# Patient Record
Sex: Male | Born: 1995 | Race: White | Hispanic: No | Marital: Single | Smoking: Former smoker
Health system: Southern US, Community
[De-identification: ages and names within clinical notes are randomized; demographics above are authoritative.]

## PROBLEM LIST (undated history)

## (undated) ENCOUNTER — Ambulatory Visit: Admission: EM | Payer: Medicaid Other

## (undated) DIAGNOSIS — J9601 Acute respiratory failure with hypoxia: Secondary | ICD-10-CM

## (undated) DIAGNOSIS — T1491XA Suicide attempt, initial encounter: Secondary | ICD-10-CM

## (undated) HISTORY — DX: Acute respiratory failure with hypoxia: J96.01

## (undated) HISTORY — PX: IR GASTR TUBE CONVERT GASTR-JEJ PER W/FL MOD SED: IMG2332

---
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
## 2014-06-24 ENCOUNTER — Emergency Department (HOSPITAL_COMMUNITY): Payer: No Typology Code available for payment source

## 2014-06-24 ENCOUNTER — Encounter (HOSPITAL_COMMUNITY): Payer: Self-pay | Admitting: Emergency Medicine

## 2014-06-24 ENCOUNTER — Emergency Department (HOSPITAL_COMMUNITY)
Admission: EM | Admit: 2014-06-24 | Discharge: 2014-06-24 | Disposition: A | Discharge status: Home / Self Care | Payer: No Typology Code available for payment source | Attending: Emergency Medicine | Admitting: Emergency Medicine

## 2014-06-24 DIAGNOSIS — S0181XA Laceration without foreign body of other part of head, initial encounter: Secondary | ICD-10-CM

## 2014-06-24 DIAGNOSIS — Y92410 Unspecified street and highway as the place of occurrence of the external cause: Secondary | ICD-10-CM | POA: Insufficient documentation

## 2014-06-24 DIAGNOSIS — IMO0002 Reserved for concepts with insufficient information to code with codable children: Secondary | ICD-10-CM | POA: Insufficient documentation

## 2014-06-24 DIAGNOSIS — S8990XA Unspecified injury of unspecified lower leg, initial encounter: Secondary | ICD-10-CM | POA: Insufficient documentation

## 2014-06-24 DIAGNOSIS — S46909A Unspecified injury of unspecified muscle, fascia and tendon at shoulder and upper arm level, unspecified arm, initial encounter: Secondary | ICD-10-CM | POA: Insufficient documentation

## 2014-06-24 DIAGNOSIS — S4992XA Unspecified injury of left shoulder and upper arm, initial encounter: Secondary | ICD-10-CM

## 2014-06-24 DIAGNOSIS — Z87891 Personal history of nicotine dependence: Secondary | ICD-10-CM | POA: Insufficient documentation

## 2014-06-24 DIAGNOSIS — S4980XA Other specified injuries of shoulder and upper arm, unspecified arm, initial encounter: Secondary | ICD-10-CM | POA: Insufficient documentation

## 2014-06-24 DIAGNOSIS — S99919A Unspecified injury of unspecified ankle, initial encounter: Secondary | ICD-10-CM

## 2014-06-24 DIAGNOSIS — S060X1A Concussion with loss of consciousness of 30 minutes or less, initial encounter: Secondary | ICD-10-CM | POA: Insufficient documentation

## 2014-06-24 DIAGNOSIS — Y9389 Activity, other specified: Secondary | ICD-10-CM | POA: Insufficient documentation

## 2014-06-24 DIAGNOSIS — S99929A Unspecified injury of unspecified foot, initial encounter: Secondary | ICD-10-CM

## 2014-06-24 DIAGNOSIS — S0990XA Unspecified injury of head, initial encounter: Secondary | ICD-10-CM | POA: Insufficient documentation

## 2014-06-24 DIAGNOSIS — S0180XA Unspecified open wound of other part of head, initial encounter: Secondary | ICD-10-CM | POA: Insufficient documentation

## 2014-06-24 DIAGNOSIS — S8991XA Unspecified injury of right lower leg, initial encounter: Secondary | ICD-10-CM

## 2014-06-24 HISTORY — DX: Suicide attempt, initial encounter: T14.91XA

## 2014-06-24 LAB — CBC WITH DIFFERENTIAL/PLATELET
BASOS ABS: 0 10*3/uL (ref 0.0–0.1)
Basophils Relative: 0 % (ref 0–1)
EOS ABS: 0 10*3/uL (ref 0.0–0.7)
EOS PCT: 0 % (ref 0–5)
HCT: 47.6 % (ref 39.0–52.0)
Hemoglobin: 16.7 g/dL (ref 13.0–17.0)
Lymphocytes Relative: 10 % — ABNORMAL LOW (ref 12–46)
Lymphs Abs: 1.8 10*3/uL (ref 0.7–4.0)
MCH: 32.6 pg (ref 26.0–34.0)
MCHC: 35.1 g/dL (ref 30.0–36.0)
MCV: 93 fL (ref 78.0–100.0)
Monocytes Absolute: 0.9 10*3/uL (ref 0.1–1.0)
Monocytes Relative: 5 % (ref 3–12)
Neutro Abs: 14.6 10*3/uL — ABNORMAL HIGH (ref 1.7–7.7)
Neutrophils Relative %: 85 % — ABNORMAL HIGH (ref 43–77)
PLATELETS: 254 10*3/uL (ref 150–400)
RBC: 5.12 MIL/uL (ref 4.22–5.81)
RDW: 12.3 % (ref 11.5–15.5)
WBC: 17.3 10*3/uL — AB (ref 4.0–10.5)

## 2014-06-24 MED ORDER — ONDANSETRON HCL 4 MG/2ML IJ SOLN: 4.0000 mg | Freq: Once | INTRAMUSCULAR | Status: DC

## 2014-06-24 MED ORDER — SODIUM CHLORIDE 0.9 % IV BOLUS (SEPSIS)
500.0000 mL | Freq: Once | INTRAVENOUS | Status: AC
  Administered 2014-06-24: 500 mL via INTRAVENOUS

## 2014-06-24 MED ORDER — IOHEXOL 300 MG/ML  SOLN
100.0000 mL | Freq: Once | INTRAMUSCULAR | Status: AC | PRN
  Administered 2014-06-24: 100 mL via INTRAVENOUS

## 2014-06-24 MED ORDER — NAPROXEN 500 MG PO TABS: 500.0000 mg | ORAL_TABLET | Freq: Two times a day (BID) | ORAL | Status: DC

## 2014-06-24 MED ORDER — HYDROMORPHONE HCL PF 1 MG/ML IJ SOLN
1.0000 mg | Freq: Once | INTRAMUSCULAR | Status: AC
  Administered 2014-06-24: 1 mg via INTRAVENOUS
  Filled 2014-06-24: qty 1

## 2014-06-24 MED ORDER — HYDROCODONE-ACETAMINOPHEN 5-325 MG PO TABS: 1.0000 | ORAL_TABLET | Freq: Four times a day (QID) | ORAL | Status: DC | PRN

## 2014-06-24 MED ORDER — SODIUM CHLORIDE 0.9 % IV SOLN
INTRAVENOUS | Status: DC
  Administered 2014-06-24: 21:00:00 via INTRAVENOUS

## 2014-06-24 NOTE — ED Notes (Signed)
Patient verbalizes understanding of discharge instructions, prescription medications, pain management, and follow up care if needed. Patient ambulatory out of department at this time escorted by friend and family.
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
## 2014-06-24 NOTE — ED Notes (Signed)
Patient resting in a position of comfort, friends at bedside. Patient denies having any pain at this time, and patient states he does not need anything for nausea. Patient is alert and oriented X4 at this time. No other needs voiced.
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
## 2014-06-24 NOTE — Discharge Instructions (Signed)
Expect to be sore and stiff for the next few days. Dress the chin laceration with antibiotic ointment and Band-Aid. Take the Naprosyn on a regular basis. Supplement with the hydrocodone as needed for additional pain. Followup for any newer worse symptoms or if not improving over the next few days.

## 2014-08-04 ENCOUNTER — Ambulatory Visit: Payer: Self-pay

## 2019-09-04 ENCOUNTER — Emergency Department: Payer: BC Managed Care – PPO

## 2019-09-04 ENCOUNTER — Inpatient Hospital Stay
Admission: EM | Admit: 2019-09-04 | Discharge: 2019-09-09 | DRG: 438 | Disposition: A | Discharge status: Home / Self Care | Payer: BC Managed Care – PPO | Attending: Internal Medicine | Admitting: Internal Medicine

## 2019-09-04 ENCOUNTER — Other Ambulatory Visit: Payer: Self-pay

## 2019-09-04 ENCOUNTER — Inpatient Hospital Stay: Payer: BC Managed Care – PPO

## 2019-09-04 DIAGNOSIS — Z20828 Contact with and (suspected) exposure to other viral communicable diseases: Secondary | ICD-10-CM | POA: Diagnosis present

## 2019-09-04 DIAGNOSIS — R000 Tachycardia, unspecified: Secondary | ICD-10-CM | POA: Diagnosis present

## 2019-09-04 DIAGNOSIS — Z87891 Personal history of nicotine dependence: Secondary | ICD-10-CM

## 2019-09-04 DIAGNOSIS — I361 Nonrheumatic tricuspid (valve) insufficiency: Secondary | ICD-10-CM | POA: Diagnosis not present

## 2019-09-04 DIAGNOSIS — E876 Hypokalemia: Secondary | ICD-10-CM | POA: Diagnosis present

## 2019-09-04 DIAGNOSIS — J181 Lobar pneumonia, unspecified organism: Secondary | ICD-10-CM | POA: Diagnosis not present

## 2019-09-04 DIAGNOSIS — K625 Hemorrhage of anus and rectum: Secondary | ICD-10-CM | POA: Diagnosis not present

## 2019-09-04 DIAGNOSIS — R1012 Left upper quadrant pain: Secondary | ICD-10-CM

## 2019-09-04 DIAGNOSIS — J189 Pneumonia, unspecified organism: Secondary | ICD-10-CM | POA: Diagnosis present

## 2019-09-04 DIAGNOSIS — K859 Acute pancreatitis without necrosis or infection, unspecified: Secondary | ICD-10-CM | POA: Diagnosis present

## 2019-09-04 DIAGNOSIS — E860 Dehydration: Secondary | ICD-10-CM | POA: Diagnosis present

## 2019-09-04 DIAGNOSIS — K852 Alcohol induced acute pancreatitis without necrosis or infection: Secondary | ICD-10-CM | POA: Diagnosis present

## 2019-09-04 DIAGNOSIS — E861 Hypovolemia: Secondary | ICD-10-CM | POA: Diagnosis present

## 2019-09-04 DIAGNOSIS — Z8249 Family history of ischemic heart disease and other diseases of the circulatory system: Secondary | ICD-10-CM

## 2019-09-04 DIAGNOSIS — I10 Essential (primary) hypertension: Secondary | ICD-10-CM | POA: Diagnosis present

## 2019-09-04 DIAGNOSIS — K219 Gastro-esophageal reflux disease without esophagitis: Secondary | ICD-10-CM | POA: Diagnosis present

## 2019-09-04 DIAGNOSIS — Z791 Long term (current) use of non-steroidal anti-inflammatories (NSAID): Secondary | ICD-10-CM

## 2019-09-04 DIAGNOSIS — K860 Alcohol-induced chronic pancreatitis: Secondary | ICD-10-CM | POA: Diagnosis present

## 2019-09-04 DIAGNOSIS — K760 Fatty (change of) liver, not elsewhere classified: Secondary | ICD-10-CM | POA: Diagnosis present

## 2019-09-04 DIAGNOSIS — F1010 Alcohol abuse, uncomplicated: Secondary | ICD-10-CM | POA: Diagnosis present

## 2019-09-04 DIAGNOSIS — J9601 Acute respiratory failure with hypoxia: Secondary | ICD-10-CM

## 2019-09-04 DIAGNOSIS — K828 Other specified diseases of gallbladder: Secondary | ICD-10-CM | POA: Diagnosis present

## 2019-09-04 DIAGNOSIS — R109 Unspecified abdominal pain: Secondary | ICD-10-CM

## 2019-09-04 DIAGNOSIS — R0902 Hypoxemia: Secondary | ICD-10-CM

## 2019-09-04 DIAGNOSIS — R1013 Epigastric pain: Secondary | ICD-10-CM | POA: Diagnosis present

## 2019-09-04 HISTORY — DX: Alcohol induced acute pancreatitis without necrosis or infection: K85.20

## 2019-09-04 HISTORY — DX: Gastro-esophageal reflux disease without esophagitis: K21.9

## 2019-09-04 HISTORY — DX: Acute pancreatitis without necrosis or infection, unspecified: K85.90

## 2019-09-04 LAB — SARS CORONAVIRUS 2 (TAT 6-24 HRS): SARS Coronavirus 2: NEGATIVE

## 2019-09-04 LAB — BASIC METABOLIC PANEL
Anion gap: 10 (ref 5–15)
BUN: 12 mg/dL (ref 6–20)
CO2: 24 mmol/L (ref 22–32)
Calcium: 8.8 mg/dL — ABNORMAL LOW (ref 8.9–10.3)
Chloride: 105 mmol/L (ref 98–111)
Creatinine, Ser: 1.01 mg/dL (ref 0.61–1.24)
GFR calc Af Amer: >60 mL/min (ref >60)
GFR calc non Af Amer: >60 mL/min (ref >60)
Glucose, Bld: 128 mg/dL — ABNORMAL HIGH (ref 70–99)
Potassium: 4 mmol/L (ref 3.5–5.1)
Sodium: 139 mmol/L (ref 135–145)

## 2019-09-04 LAB — TROPONIN I (HIGH SENSITIVITY)
Troponin I (High Sensitivity): 6 ng/L (ref <18)
Troponin I (High Sensitivity): 5 ng/L (ref <18)

## 2019-09-04 LAB — HEPATIC FUNCTION PANEL
ALT: 120 U/L — ABNORMAL HIGH (ref 0–44)
AST: 93 U/L — ABNORMAL HIGH (ref 15–41)
Albumin: 4.5 g/dL (ref 3.5–5.0)
Alkaline Phosphatase: 93 U/L (ref 38–126)
Bilirubin, Direct: 0.1 mg/dL (ref 0.0–0.2)
Indirect Bilirubin: 0.5 mg/dL (ref 0.3–0.9)
Total Bilirubin: 0.6 mg/dL (ref 0.3–1.2)
Total Protein: 8.3 g/dL — ABNORMAL HIGH (ref 6.5–8.1)

## 2019-09-04 LAB — CBC
HCT: 48.5 % (ref 39.0–52.0)
Hemoglobin: 17.5 g/dL — ABNORMAL HIGH (ref 13.0–17.0)
MCH: 34.2 pg — ABNORMAL HIGH (ref 26.0–34.0)
MCHC: 36.1 g/dL — ABNORMAL HIGH (ref 30.0–36.0)
MCV: 94.7 fL (ref 80.0–100.0)
Platelets: 334 10*3/uL (ref 150–400)
RBC: 5.12 MIL/uL (ref 4.22–5.81)
RDW: 11.9 % (ref 11.5–15.5)
WBC: 15.9 10*3/uL — ABNORMAL HIGH (ref 4.0–10.5)
nRBC: 0 % (ref 0.0–0.2)

## 2019-09-04 LAB — LIPID PANEL
Cholesterol: 137 mg/dL (ref 0–200)
HDL: 68 mg/dL (ref >40)
LDL Cholesterol: 59 mg/dL (ref 0–99)
Total CHOL/HDL Ratio: 2 RATIO
Triglycerides: 52 mg/dL (ref <150)
VLDL: 10 mg/dL (ref 0–40)

## 2019-09-04 LAB — MAGNESIUM: Magnesium: 2.1 mg/dL (ref 1.7–2.4)

## 2019-09-04 LAB — LIPASE, BLOOD: Lipase: 2993 U/L — ABNORMAL HIGH (ref 11–51)

## 2019-09-04 MED ORDER — HYDROMORPHONE HCL 1 MG/ML IJ SOLN
1.0000 mg | INTRAMUSCULAR | Status: DC | PRN
  Administered 2019-09-04 – 2019-09-07 (×12): 1 mg via INTRAVENOUS
  Filled 2019-09-04 (×12): qty 1

## 2019-09-04 MED ORDER — ONDANSETRON HCL 4 MG/2ML IJ SOLN
4.0000 mg | Freq: Once | INTRAMUSCULAR | Status: AC
  Administered 2019-09-04: 11:00:00 4 mg via INTRAVENOUS

## 2019-09-04 MED ORDER — MORPHINE SULFATE (PF) 4 MG/ML IV SOLN
INTRAVENOUS | Status: AC
  Administered 2019-09-04: 09:00:00 4 mg via INTRAVENOUS
  Filled 2019-09-04: qty 1

## 2019-09-04 MED ORDER — PROMETHAZINE HCL 25 MG/ML IJ SOLN
12.5000 mg | Freq: Once | INTRAMUSCULAR | Status: AC
  Administered 2019-09-04: 14:00:00 12.5 mg via INTRAVENOUS
  Filled 2019-09-04: qty 1

## 2019-09-04 MED ORDER — SODIUM CHLORIDE 0.9 % IV SOLN
8.0000 mg | Freq: Once | INTRAVENOUS | Status: AC
  Administered 2019-09-04: 8 mg via INTRAVENOUS
  Filled 2019-09-04: qty 4

## 2019-09-04 MED ORDER — ONDANSETRON HCL 4 MG/2ML IJ SOLN
4.0000 mg | Freq: Once | INTRAMUSCULAR | Status: AC
  Administered 2019-09-04: 4 mg via INTRAVENOUS

## 2019-09-04 MED ORDER — HYDROMORPHONE HCL 1 MG/ML IJ SOLN
1.0000 mg | Freq: Once | INTRAMUSCULAR | Status: AC
  Administered 2019-09-04: 17:00:00 1 mg via INTRAVENOUS
  Filled 2019-09-04: qty 1

## 2019-09-04 MED ORDER — HYDROMORPHONE HCL 1 MG/ML IJ SOLN
1.0000 mg | Freq: Once | INTRAMUSCULAR | Status: AC
  Administered 2019-09-04: 15:00:00 1 mg via INTRAVENOUS
  Filled 2019-09-04: qty 1

## 2019-09-04 MED ORDER — SODIUM CHLORIDE 0.9 % IV SOLN
Freq: Once | INTRAVENOUS | Status: AC
  Administered 2019-09-04: 11:00:00 via INTRAVENOUS

## 2019-09-04 MED ORDER — THIAMINE HCL 100 MG/ML IJ SOLN
100.0000 mg | Freq: Once | INTRAMUSCULAR | Status: AC
  Administered 2019-09-04: 13:00:00 100 mg via INTRAVENOUS
  Filled 2019-09-04: qty 2

## 2019-09-04 MED ORDER — IOHEXOL 350 MG/ML SOLN
75.0000 mL | Freq: Once | INTRAVENOUS | Status: AC | PRN
  Administered 2019-09-04: 75 mL via INTRAVENOUS

## 2019-09-04 MED ORDER — ONDANSETRON HCL 4 MG/2ML IJ SOLN
4.0000 mg | Freq: Once | INTRAMUSCULAR | Status: AC
  Administered 2019-09-04: 07:00:00 4 mg via INTRAVENOUS

## 2019-09-04 MED ORDER — ONDANSETRON HCL 4 MG/2ML IJ SOLN
INTRAMUSCULAR | Status: AC
  Filled 2019-09-04: qty 2

## 2019-09-04 MED ORDER — HEPARIN SODIUM (PORCINE) 5000 UNIT/ML IJ SOLN
5000.0000 [IU] | Freq: Three times a day (TID) | INTRAMUSCULAR | Status: DC
  Administered 2019-09-04: 15:00:00 5000 [IU] via SUBCUTANEOUS
  Filled 2019-09-04 (×4): qty 1

## 2019-09-04 MED ORDER — PANTOPRAZOLE SODIUM 40 MG IV SOLR
40.0000 mg | Freq: Two times a day (BID) | INTRAVENOUS | Status: DC
  Administered 2019-09-04 – 2019-09-07 (×7): 40 mg via INTRAVENOUS
  Filled 2019-09-04 (×8): qty 40

## 2019-09-04 MED ORDER — LACTATED RINGERS IV SOLN
INTRAVENOUS | Status: DC
  Administered 2019-09-04 – 2019-09-08 (×8): via INTRAVENOUS

## 2019-09-04 MED ORDER — HYDROMORPHONE HCL 1 MG/ML IJ SOLN
INTRAMUSCULAR | Status: AC
  Administered 2019-09-04: 11:00:00 1 mg via INTRAVENOUS
  Filled 2019-09-04: qty 1

## 2019-09-04 MED ORDER — ONDANSETRON HCL 4 MG/2ML IJ SOLN
INTRAMUSCULAR | Status: AC
  Filled 2019-09-04: qty 4

## 2019-09-04 MED ORDER — HYDROMORPHONE HCL 1 MG/ML IJ SOLN
1.0000 mg | Freq: Once | INTRAMUSCULAR | Status: AC
  Administered 2019-09-04: 11:00:00 1 mg via INTRAVENOUS

## 2019-09-04 MED ORDER — MORPHINE SULFATE (PF) 4 MG/ML IV SOLN
4.0000 mg | Freq: Once | INTRAVENOUS | Status: AC
  Administered 2019-09-04: 09:00:00 4 mg via INTRAVENOUS

## 2019-09-04 MED ORDER — VITAMIN B-1 100 MG PO TABS: 100.0000 mg | ORAL_TABLET | Freq: Every day | ORAL | Status: DC

## 2019-09-04 MED ORDER — THIAMINE HCL 100 MG/ML IJ SOLN
INTRAVENOUS | Status: DC
  Administered 2019-09-04: 12:00:00 via INTRAVENOUS
  Filled 2019-09-04 (×4): qty 1000

## 2019-09-04 MED ORDER — SODIUM CHLORIDE 0.9 % IV SOLN
8.0000 mg | Freq: Three times a day (TID) | INTRAVENOUS | Status: DC | PRN
  Administered 2019-09-04: 19:00:00 8 mg via INTRAVENOUS
  Filled 2019-09-04 (×2): qty 4

## 2019-09-04 MED ORDER — MORPHINE SULFATE (PF) 4 MG/ML IV SOLN
4.0000 mg | Freq: Once | INTRAVENOUS | Status: AC
  Administered 2019-09-04: 4 mg via INTRAVENOUS
  Filled 2019-09-04: qty 1

## 2019-09-04 NOTE — H&P (Addendum)
History and Physical    Joshua Morton ZOX:[id number] DOB: 06-21-1996 DOA: 09/04/2019  PCP: Patient, No Pcp Per (Confirm with patient/family/NH records and if not entered, this has to be entered at [facility] point of entry) Patient coming from: Home I have personally briefly reviewed patient's old medical records in [facility]  Chief Complaint:  Abdominal pain HPI: Joshua Morton is a 23 y.o. male with medical history significant of alcohol abuse seen in the emergency room for abdominal pain, located midepigastrium left-sided of the abdomen and radiating to the left chest wall and left neck.  10/10, sharp pain, constant pain, associated with nausea and vomiting, that started today at 6:30 in the morning at rest.   Patient states that there is nothing that has helped his abdominal pain, and even coming into the hospital he is still in 10/10 pain.  Does get worse with even the slightest movement.,  Stable and decreases at rest.  Patient states that his last alcoholic drink was last night, he has been drinking since the age of 38, and has been drinking heavy since the past 2 years where he drinks about 1/5 of vodka daily.  He denies any history of withdrawals or seizures or the shakes.  Patient reports having blood in the stool 2 days ago.  He also reports using BC powders for headaches and has been using BC powders for headaches since the age of 34.  This is the first time that he is ever had this kind of pain.  Although he does note that he has gained about 70 pounds in the past 2 years and questions of the alcohol use can do that.  Informed patient that excessive amounts of alcohol can contribute to weight gain.  Counseled patient extensively on alcohol cessation, complications related to alcohol patient verbalized understanding patient does report history of DUIs and having been told by family that he drinks too much.  Also discussed with patient to establish with a primary care to help him  with the weaning plan to avoid withdrawals.  Patient denies using drugs and smoking.  ED Course: In the emergency room patient is alert awake and oriented and lying still secondary to pain. Blood pressure (!) 138/91, pulse 88, temperature 99.1 F (37.3 C), temperature source Oral, resp. rate 20, height  (1.803 m), weight 90.7 kg, SpO2 96 %.  Patient has had an initial CTA of the chest which is negative for pulmonary embolism, extensive fatty infiltration of the liver, peripancreatic edema consistent with acute pancreatitis.  Patient has received a total of 8 mg of morphine, 1 L of normal saline, 4 mg of Zofran and is still having abdominal pain and nausea.   Review of Systems: As per HPI otherwise 10 point review of systems negative.  Overall ROS is positive for left-sided chest wall pain left-sided abdominal pain with mid epigastric area, nausea and vomiting,  Past Medical History:  Diagnosis Date   Attempted suicide [facility])     History reviewed. No pertinent surgical history.   reports that he has quit smoking. His smoking use included cigarettes. He smoked 0.10 packs per day. He does not have any smokeless tobacco history on file. He reports current alcohol use. He reports that he does not use drugs.  No Known Allergies  No family history on file. Mom: No significant health history. Dad: Hypertension history.  Prior to Admission medications   Medication Sig Start Date End Date Taking? Authorizing Provider  HYDROcodone-acetaminophen (NORCO/VICODIN) 5-325  MG per tablet Take 1-2 tablets by mouth every 6 (six) hours as needed. 06/24/14   Vanetta MuldersZackowski, [place], MD  naproxen (NAPROSYN) 500 MG tablet Take 1 tablet (500 mg total) by mouth 2 (two) times daily. 06/24/14   Vanetta MuldersZackowski, [place], MD    Physical Exam: Vitals:   09/04/19 0915 09/04/19 0930 09/04/19 0945 09/04/19 1000  BP:    (!) 138/91  Pulse: 88 87 94 88  Resp: 20 (!) 22 20 20   Temp:      TempSrc:      SpO2: 98% 96% 99% 96%    Weight:      Height:        Constitutional: NAD, calm, comfortable Vitals:   09/04/19 0915 09/04/19 0930 09/04/19 0945 09/04/19 1000  BP:    (!) 138/91  Pulse: 88 87 94 88  Resp: 20 (!) 22 20 20   Temp:      TempSrc:      SpO2: 98% 96% 99% 96%  Weight:      Height:       Eyes: PERRL, lids and conjunctivae normal ENMT: Mucous membranes are moist. Posterior pharynx clear of any exudate or lesions.Normal dentition.  Neck: normal, supple, no masses, no thyromegaly Respiratory: clear to auscultation bilaterally, no wheezing, no crackles. Normal respiratory effort. No accessory muscle use.  Cardiovascular: Regular rate and rhythm, no murmurs / rubs / gallops. No extremity edema. 2+ pedal pulses. No carotid bruits.  Abdomen: no tenderness, no masses palpated. No hepatosplenomegaly. Bowel sounds positive.  Musculoskeletal: no clubbing / cyanosis. No joint deformity upper and lower extremities. Good ROM, no contractures. Normal muscle tone.  Skin: no rashes, lesions, ulcers. No induration Neurologic: CN 2-12 grossly intact. Sensation intact, DTR normal. Strength 5/5 in all 4.  Psychiatric: Normal judgment and insight. Alert and oriented x 3. Normal mood.    Labs on Admission: I have personally reviewed following labs and imaging studies  CBC: Recent Labs  Lab 09/04/19 0705  WBC 15.9*  HGB 17.5*  HCT 48.5  MCV 94.7  PLT 334   Basic Metabolic Panel: Recent Labs  Lab 09/04/19 0705  NA 139  K 4.0  CL 105  CO2 24  GLUCOSE 128*  BUN 12  CREATININE 1.01  CALCIUM 8.8*  MG 2.1   GFR: Estimated Creatinine Clearance: 130 mL/min (by C-G formula based on SCr of 1.01 mg/dL). Liver Function Tests: Recent Labs  Lab 09/04/19 0705  AST 93*  ALT 120*  ALKPHOS 93  BILITOT 0.6  PROT 8.3*  ALBUMIN 4.5   Recent Labs  Lab 09/04/19 0705  LIPASE 2,993*   No results for input(s): AMMONIA in the last 168 hours. Coagulation Profile: No results for input(s): INR, PROTIME in the  last 168 hours. Cardiac Enzymes: No results for input(s): CKTOTAL, CKMB, CKMBINDEX, TROPONINI in the last 168 hours. BNP (last 3 results) No results for input(s): PROBNP in the last 8760 hours. HbA1C: No results for input(s): HGBA1C in the last 72 hours. CBG: No results for input(s): GLUCAP in the last 168 hours. Lipid Profile: Recent Labs    09/04/19 0705  CHOL 137  HDL 68  LDLCALC 59  TRIG 52  CHOLHDL 2.0   Thyroid Function Tests: No results for input(s): TSH, T4TOTAL, FREET4, T3FREE, THYROIDAB in the last 72 hours. Anemia Panel: No results for input(s): VITAMINB12, FOLATE, FERRITIN, TIBC, IRON, RETICCTPCT in the last 72 hours. Urine analysis: No results found for: COLORURINE, APPEARANCEUR, LABSPEC, PHURINE, GLUCOSEU, HGBUR, BILIRUBINUR, KETONESUR, PROTEINUR, UROBILINOGEN, NITRITE, LEUKOCYTESUR  Radiological Exams on Admission: Ct Angio Chest Pe W And/or Wo Contrast  Result Date: 09/04/2019 CLINICAL DATA:  Acute left-sided chest pain with short of breath EXAM: CT ANGIOGRAPHY CHEST WITH CONTRAST TECHNIQUE: Multidetector CT imaging of the chest was performed using the standard protocol during bolus administration of intravenous contrast. Multiplanar CT image reconstructions and MIPs were obtained to evaluate the vascular anatomy. CONTRAST:  25mL OMNIPAQUE IOHEXOL 350 MG/ML SOLN COMPARISON:  CT chest 06/25/2014 FINDINGS: Cardiovascular: Negative for pulmonary embolism. Pulmonary arteries normal in caliber. Normal aortic arch. Heart size normal. Mediastinum/Nodes: Negative for mass or adenopathy Lungs/Pleura: Lungs are well aerated and clear. No infiltrate effusion or mass. Upper Abdomen: Extensive fatty infiltration of the liver. Peripancreatic edema compatible with acute pancreatitis. Lipase level currently pending. Musculoskeletal: No focal skeletal abnormality. Review of the MIP images confirms the above findings. IMPRESSION: Negative for pulmonary embolism.  No acute abnormality in  the chest Extensive fatty infiltration of the liver. Peripancreatic edema consistent with acute pancreatitis. Electronically Signed   By: Marlan Palau M.D.   On: 09/04/2019 08:59   US Abdomen Limited Ruq  Result Date: 09/04/2019 CLINICAL DATA:  Abdominal pain.  Pancreatitis. EXAM: ULTRASOUND ABDOMEN LIMITED RIGHT UPPER QUADRANT COMPARISON:  CT abdomen/pelvis from 06/25/2014. FINDINGS: Gallbladder: Nondistended gallbladder. No gallbladder wall thickening. No pericholecystic fluid. No sonographic Murphy sign. No gallstones. Layering sludge. Common bile duct: Diameter: 3 mm Liver: Diffusely markedly echogenic liver parenchyma with prominent posterior acoustic attenuation, compatible with diffuse hepatic steatosis. No definite liver surface irregularity. No liver mass is demonstrated, noting significantly decreased sensitivity in the setting of a markedly echogenic liver. Portal vein is patent on color Doppler imaging with normal direction of blood flow towards the liver. Other: None. IMPRESSION: 1. Marked diffuse hepatic steatosis. 2. Gallbladder sludge.  No cholelithiasis. 3. No biliary ductal dilatation. Electronically Signed   By: Delbert Phenix M.D.   On: 09/04/2019 12:06    EKG: Independently reviewed.  Sinus tachycardia heart rate of 117.  Otherwise normal EKG. Assessment/Plan Principal Problem:   Acute pancreatitis Active Problems:   Fatty liver   Alcohol abuse   GERD (gastroesophageal reflux disease)   BRBPR (bright red blood per rectum)    Pancreatitis - Imaging shows peripancreatic edema consistent with acute pancreatitis. Lipase is: 2993.  LFTs are: WNL elevated. (RUQ ultrasound ordered). - Differential includes gallstones, alcohol,  hypercalcemia and hypertriglyceridemia and rarely, IgG-4 related systemic disease.  Source of pancreatitis felt to be from: Alcohol - Disease severity is: Mild (no organ failure/local complications)  - Monitor BUN, hemoglobin, creatinine (elevations show  hypovolemia). - Monitor WBC and fluid volume shifts (indicate inflammation). - Monitor creatinine, LFTs, and oxygenation status (indicates organ damage). - Monitor calcium (low calcium indicative of fat necrosis/saponification---end organ damage & hypovolemia). - Consider CT if no improvement in 48-72 hours. If recurrent episode, obtain EUS to rule out occult malignancy or microlithiasis. - Management is supportive: IVF with LR, lactated ringers decreases SIRS and organ failure risk by up to 80% compared to NS). - Monitor urine output.  Aim for > 0.5to 1 mL/kg/hour. - Bowel rest until pain improved.  Advance to low fat when able to tolerate. - Early enteral nutrition improves morbidity (infection) over total parenteral nutrition (TPN) or nothing-by-mouth status (NPO). Nasojejunal feeding via a small-bore tube is preferred in patients who cannot tolerate oral intake, or gastric feeding via nasogastric tube provided there is not frequent emesis. - Monitor for intraabdominal compartment syndrome if aggressive IVF needed. - Monitor for complications:  Glucose intolerance, DM, thrombosis of the portal vein, splenic aneurysm.   Fatty liver: secondary to etoh abuse D/w pt about refraining from alcohol and wean off with help from pcp and psychologist as outpatient.     Alcohol abuse -serum etoh level pending. - CIWA protocol. - Supplement thiamine, folic acid, MVI. - SW consult for substance abuse counseling. - Consider psychiatric consultation when able to participate.   GERD  Counsel on weight loss and smoking cessation if obesity/smoking.  Counsel of avoiding any triggering foods.  Counsel on appropriate dietary changes (small, more frequent meals, avoid eating 4 hours prior to      bedtime).  Elevate the head of the bed.  PPI if no alarm symptoms (dysphagia, anemia, vomiting or weight loss).  Recommend adequate calcium and vitamin D intake if on PPI long-term.  EGD or ambulatory pH  monitoring in refractory cases.   BRBPR: Guaiac pending We will follow cbc. Iv ppi therapy.  Pt advised to refrain from any nsaid use.  Consider GI consult.   DVT prophylaxis: Heparin  Code Status: Full code  Family Communication: Reuel Boom: [phone]( friend)  Disposition Plan: D/C home within 3-4 days depending on results.  Consults called: None Admission status: Inpatient    Gertha Calkin MD [facility] If 7PM-7AM, please contact night-coverage [email or web address] Password [email or web address]  09/04/2019, 12:20 PM     18:22 Chart review:  Labs as below: Results for orders placed or performed during the hospital encounter of 09/04/19 (from the past 48 hour(s))  Basic metabolic panel     Status: Abnormal   Collection Time: 09/04/19  7:05 AM  Result Value Ref Range   Sodium 139 135 - 145 mmol/L   Potassium 4.0 3.5 - 5.1 mmol/L   Chloride 105 98 - 111 mmol/L   CO2 24 22 - 32 mmol/L   Glucose, Bld 128 (H) 70 - 99 mg/dL   BUN 12 6 - 20 mg/dL   Creatinine, Ser 0.98 0.61 - 1.24 mg/dL   Calcium 8.8 (L) 8.9 - 10.3 mg/dL   GFR calc non Af Amer >60 >60 mL/min   GFR calc Af Amer >60 >60 mL/min   Anion gap 10 5 - 15    Comment: Performed at [facility], [address] [place], [place] [address]  CBC     Status: Abnormal   Collection Time: 09/04/19  7:05 AM  Result Value Ref Range   WBC 15.9 (H) 4.0 - 10.5 K/uL   RBC 5.12 4.22 - 5.81 MIL/uL   Hemoglobin 17.5 (H) 13.0 - 17.0 g/dL   HCT 78.2 95.6 - 21.3 %   MCV 94.7 80.0 - 100.0 fL   MCH 34.2 (H) 26.0 - 34.0 pg   MCHC 36.1 (H) 30.0 - 36.0 g/dL   RDW 08.6 57.8 - 46.9 %   Platelets 334 150 - 400 K/uL   nRBC 0.0 0.0 - 0.2 %    Comment: Performed at [facility], [address], [place], [place] [address]  Troponin I (High Sensitivity)     Status: None   Collection Time: 09/04/19  7:05 AM  Result Value Ref Range   Troponin I (High Sensitivity) 6 <18 ng/L    Comment: (NOTE) Elevated high sensitivity troponin I  (hsTnI) values and significant  changes across serial measurements may suggest ACS but many other  chronic and acute conditions are known to elevate hsTnI results.  Refer to the "Links" section for chest pain algorithms and additional  guidance. Performed  at [facility], [address] [place], [place] [address]   Lipase, blood     Status: Abnormal   Collection Time: 09/04/19  7:05 AM  Result Value Ref Range   Lipase 2,993 (H) 11 - 51 U/L    Comment: RESULT CONFIRMED BY MANUAL DILUTION DAS Performed at [facility], [address] [place], [place] [address]   Hepatic function panel     Status: Abnormal   Collection Time: 09/04/19  7:05 AM  Result Value Ref Range   Total Protein 8.3 (H) 6.5 - 8.1 g/dL   Albumin 4.5 3.5 - 5.0 g/dL   AST 93 (H) 15 - 41 U/L   ALT 120 (H) 0 - 44 U/L   Alkaline Phosphatase 93 38 - 126 U/L   Total Bilirubin 0.6 0.3 - 1.2 mg/dL   Bilirubin, Direct 0.1 0.0 - 0.2 mg/dL   Indirect Bilirubin 0.5 0.3 - 0.9 mg/dL    Comment: Performed at [facility], [address] [place], [place] [address]  Magnesium     Status: None   Collection Time: 09/04/19  7:05 AM  Result Value Ref Range   Magnesium 2.1 1.7 - 2.4 mg/dL    Comment: Performed at [facility], [address] [place], [place] [address]  Lipid panel     Status: None   Collection Time: 09/04/19  7:05 AM  Result Value Ref Range   Cholesterol 137 0 - 200 mg/dL   Triglycerides 52 <150 mg/dL   HDL 68 >40 mg/dL   Total CHOL/HDL Ratio 2.0 RATIO   VLDL 10 0 - 40 mg/dL   LDL Cholesterol 59 0 - 99 mg/dL    Comment:        Total Cholesterol/HDL:CHD Risk Coronary Heart Disease Risk Table                     Men   Women  1/2 Average Risk   3.4   3.3  Average Risk       5.0   4.4  2 X Average Risk   9.6   7.1  3 X Average Risk  23.4   11.0        Use the calculated Patient Ratio above and the CHD Risk Table to determine the patient's CHD Risk.        ATP III  CLASSIFICATION (LDL):  <100     mg/dL   Optimal  100-129  mg/dL   Near or Above                    Optimal  130-159  mg/dL   Borderline  160-189  mg/dL   High  >190     mg/dL   Very High Performed at [facility], [place], [place] [address]   Troponin I (High Sensitivity)     Status: None   Collection Time: 09/04/19  9:00 AM  Result Value Ref Range   Troponin I (High Sensitivity) 5 <18 ng/L    Comment: (NOTE) Elevated high sensitivity troponin I (hsTnI) values and significant  changes across serial measurements may suggest ACS but many other  chronic and acute conditions are known to elevate hsTnI results.  Refer to the "Links" section for chest pain algorithms and additional  guidance. Performed at [facility], [address] [place], [place] [address]     Liver usg: Sludge no stone. Fatty liver.

## 2019-09-04 NOTE — ED Triage Notes (Signed)
Pt arrives to ED via POV from home with c/o CP and SHOB x1 hr. Pt reports CP is left-sided and non-radiating, described as "stabbing". Pt also reports SHOB that started at the same time. Pt reports +N/V. Pt denies previous cardiac history. Pt admits ETOH yesterday "I work 3rd shift". Pt is A&O, in mild distress r/t pain; RR even, regular, and unlabored.

## 2019-09-04 NOTE — ED Provider Notes (Signed)
[facility] Emergency Department Provider Note  Time seen: 7:21 AM  I have reviewed the triage vital signs and the nursing notes.   HISTORY  Chief Complaint Chest Pain and Shortness of Breath   HPI Joshua Morton is a 23 y.o. male with no significant past medical history presents to the emergency department for left-sided chest pain.  According to the patient he works third shift, had just gotten home and he developed acute onset of sharp severe left-sided chest pain.  Denies any exertional activities.  States he was just sitting when the chest pain started.  Denies any history of chest pain previously denies any family history.  Patient appears moderately uncomfortable holding the left side of his chest.  Denies any increased pain with breathing or palpation.  Denies any fever cough.  Patient does state nausea and vomiting which he relates to the chest pain.  Denies diaphoresis.   Past Medical History:  Diagnosis Date  . Attempted suicide (HCC)     There are no active problems to display for this patient.   History reviewed. No pertinent surgical history.  Prior to Admission medications   Medication Sig Start Date End Date Taking? Authorizing Provider  HYDROcodone-acetaminophen (NORCO/VICODIN) 5-325 MG per tablet Take 1-2 tablets by mouth every 6 (six) hours as needed. 06/24/14   Vanetta Mulders, MD  naproxen (NAPROSYN) 500 MG tablet Take 1 tablet (500 mg total) by mouth 2 (two) times daily. 06/24/14   Vanetta Mulders, MD    No Known Allergies  No family history on file.  Social History Social History   Tobacco Use  . Smoking status: Former Smoker    Packs/day: 0.10    Types: Cigarettes  Substance Use Topics  . Alcohol use: Yes    Comment: 6 pack/week  . Drug use: No    Review of Systems Constitutional: Negative for fever. Cardiovascular: Left chest pain x1 to 2 hours Respiratory: Mild shortness of breath.  No cough. Gastrointestinal:  Negative for abdominal pain Musculoskeletal: Negative for musculoskeletal complaints Neurological: Negative for headache All other ROS negative  ____________________________________________   PHYSICAL EXAM:  VITAL SIGNS: ED Triage Vitals  Enc Vitals Group     BP 09/04/19 0702 (!) 173/127     Pulse Rate 09/04/19 0702 (!) 129     Resp 09/04/19 0702 (!) 24     Temp 09/04/19 0702 99.1 F (37.3 C)     Temp Source 09/04/19 0702 Oral     SpO2 09/04/19 0702 94 %     Weight 09/04/19 0658 200 lb (90.7 kg)     Height 09/04/19 0658 5\' 11"  (1.803 m)     Head Circumference --      Peak Flow --      Pain Score 09/04/19 0658 10     Pain Loc --      Pain Edu? --      Excl. in GC? --    Constitutional: Alert and oriented.  Mild distress holding his left chest. Eyes: Normal exam ENT      Head: Normocephalic and atraumatic.      Mouth/Throat: Mucous membranes are moist. Cardiovascular: Normal rate, regular rhythm.  Respiratory: Normal respiratory effort without tachypnea nor retractions. Breath sounds are clear.  Chest wall nontender to palpation. Gastrointestinal: Soft and nontender. No distention. Musculoskeletal: Nontender with normal range of motion in all extremities.  No lower extremity edema or tenderness. Neurologic:  Normal speech and language. No gross focal neurologic deficits Skin:  Skin is warm, dry and intact.  Psychiatric: Mood and affect are normal.   ____________________________________________    EKG  EKG viewed and interpreted by myself shows sinus tachycardia 117 bpm with a narrow QRS, normal axis, largely normal intervals with nonspecific ST changes.  ____________________________________________    RADIOLOGY  CTA negative for PE positive for possible pancreatitis  ____________________________________________   INITIAL IMPRESSION / ASSESSMENT AND PLAN / ED COURSE  Pertinent labs & imaging results that were available during my care of the patient were  reviewed by me and considered in my medical decision making (see chart for details).   Patient presents to the emergency department for acute onset of left chest pain approximately 1 to 2 hours ago.  Patient is in mild distress holding left side of his chest.  Differential would include musculoskeletal pain, chest wall pain, ACS, PE, pneumothorax, pneumonia.  We will check labs, EKG, CT of the chest, treat pain and continue to closely monitor.  Patient agreeable to plan of care.  Lipase is significantly elevated consistent with acute pancreatitis.  I discussed with the patient he does drink approximately 1/5 of vodka per day per patient.  Highly suspect alcohol induced pancreatitis.  Patient will be admitted to the hospital service for further treatment.  Joshua Morton was evaluated in Emergency Department on 09/04/2019 for the symptoms described in the history of present illness. He was evaluated in the context of the global COVID-19 pandemic, which necessitated consideration that the patient might be at risk for infection with the SARS-CoV-2 virus that causes COVID-19. Institutional protocols and algorithms that pertain to the evaluation of patients at risk for COVID-19 are in a state of rapid change based on information released by regulatory bodies including the CDC and federal and state organizations. These policies and algorithms were followed during the patient's care in the ED.  ____________________________________________   FINAL CLINICAL IMPRESSION(S) / ED DIAGNOSES  Left chest pain Acute pancreatitis   Harvest Dark, MD 09/04/19 1436

## 2019-09-05 DIAGNOSIS — R000 Tachycardia, unspecified: Secondary | ICD-10-CM

## 2019-09-05 DIAGNOSIS — F1010 Alcohol abuse, uncomplicated: Secondary | ICD-10-CM

## 2019-09-05 DIAGNOSIS — I10 Essential (primary) hypertension: Secondary | ICD-10-CM

## 2019-09-05 DIAGNOSIS — R109 Unspecified abdominal pain: Secondary | ICD-10-CM

## 2019-09-05 DIAGNOSIS — K859 Acute pancreatitis without necrosis or infection, unspecified: Secondary | ICD-10-CM

## 2019-09-05 LAB — CBC WITH DIFFERENTIAL/PLATELET
Abs Immature Granulocytes: 0.08 10*3/uL — ABNORMAL HIGH (ref 0.00–0.07)
Basophils Absolute: 0 10*3/uL (ref 0.0–0.1)
Basophils Relative: 0 %
Eosinophils Absolute: 0.5 10*3/uL (ref 0.0–0.5)
Eosinophils Relative: 3 %
HCT: 50.3 % (ref 39.0–52.0)
Hemoglobin: 17 g/dL (ref 13.0–17.0)
Immature Granulocytes: 1 %
Lymphocytes Relative: 6 %
Lymphs Abs: 0.9 10*3/uL (ref 0.7–4.0)
MCH: 34.2 pg — ABNORMAL HIGH (ref 26.0–34.0)
MCHC: 33.8 g/dL (ref 30.0–36.0)
MCV: 101.2 fL — ABNORMAL HIGH (ref 80.0–100.0)
Monocytes Absolute: 0.8 10*3/uL (ref 0.1–1.0)
Monocytes Relative: 5 %
Neutro Abs: 13.2 10*3/uL — ABNORMAL HIGH (ref 1.7–7.7)
Neutrophils Relative %: 85 %
Platelets: 244 10*3/uL (ref 150–400)
RBC: 4.97 MIL/uL (ref 4.22–5.81)
RDW: 12.4 % (ref 11.5–15.5)
WBC: 15.6 10*3/uL — ABNORMAL HIGH (ref 4.0–10.5)
nRBC: 0 % (ref 0.0–0.2)

## 2019-09-05 LAB — COMPREHENSIVE METABOLIC PANEL
ALT: 78 U/L — ABNORMAL HIGH (ref 0–44)
AST: 47 U/L — ABNORMAL HIGH (ref 15–41)
Albumin: 3.6 g/dL (ref 3.5–5.0)
Alkaline Phosphatase: 71 U/L (ref 38–126)
Anion gap: 10 (ref 5–15)
BUN: 15 mg/dL (ref 6–20)
CO2: 22 mmol/L (ref 22–32)
Calcium: 8.4 mg/dL — ABNORMAL LOW (ref 8.9–10.3)
Chloride: 106 mmol/L (ref 98–111)
Creatinine, Ser: 0.87 mg/dL (ref 0.61–1.24)
GFR calc Af Amer: >60 mL/min (ref >60)
GFR calc non Af Amer: >60 mL/min (ref >60)
Glucose, Bld: 100 mg/dL — ABNORMAL HIGH (ref 70–99)
Potassium: 4 mmol/L (ref 3.5–5.1)
Sodium: 138 mmol/L (ref 135–145)
Total Bilirubin: 1.6 mg/dL — ABNORMAL HIGH (ref 0.3–1.2)
Total Protein: 6.9 g/dL (ref 6.5–8.1)

## 2019-09-05 LAB — PHOSPHORUS: Phosphorus: 3 mg/dL (ref 2.5–4.6)

## 2019-09-05 LAB — MAGNESIUM: Magnesium: 1.9 mg/dL (ref 1.7–2.4)

## 2019-09-05 LAB — GLUCOSE, CAPILLARY: Glucose-Capillary: 97 mg/dL (ref 70–99)

## 2019-09-05 LAB — LIPASE, BLOOD: Lipase: 1153 U/L — ABNORMAL HIGH (ref 11–51)

## 2019-09-05 MED ORDER — THIAMINE HCL 100 MG/ML IJ SOLN: 100.0000 mg | Freq: Every day | INTRAMUSCULAR | Status: DC

## 2019-09-05 MED ORDER — LORAZEPAM 2 MG/ML IJ SOLN
1.0000 mg | INTRAMUSCULAR | Status: DC | PRN
  Administered 2019-09-05: 07:00:00 1 mg via INTRAVENOUS
  Filled 2019-09-05: qty 1

## 2019-09-05 MED ORDER — VITAMIN B-1 100 MG PO TABS
100.0000 mg | ORAL_TABLET | Freq: Every day | ORAL | Status: DC
  Administered 2019-09-06 – 2019-09-09 (×4): 100 mg via ORAL
  Filled 2019-09-05 (×5): qty 1

## 2019-09-05 MED ORDER — OXYCODONE HCL 5 MG PO TABS
5.0000 mg | ORAL_TABLET | ORAL | Status: DC | PRN
  Administered 2019-09-05 – 2019-09-07 (×10): 5 mg via ORAL
  Filled 2019-09-05 (×11): qty 1

## 2019-09-05 MED ORDER — VITAMIN B-1 100 MG PO TABS: 100.0000 mg | ORAL_TABLET | Freq: Every day | ORAL | Status: DC

## 2019-09-05 MED ORDER — FOLIC ACID 1 MG PO TABS
1.0000 mg | ORAL_TABLET | Freq: Every day | ORAL | Status: DC
  Administered 2019-09-06 – 2019-09-09 (×4): 1 mg via ORAL
  Filled 2019-09-05 (×4): qty 1

## 2019-09-05 MED ORDER — LORAZEPAM 1 MG PO TABS
1.0000 mg | ORAL_TABLET | ORAL | Status: DC | PRN
  Administered 2019-09-05 – 2019-09-06 (×3): 1 mg via ORAL
  Filled 2019-09-05 (×4): qty 1

## 2019-09-05 MED ORDER — THIAMINE HCL 100 MG/ML IJ SOLN
100.0000 mg | Freq: Every day | INTRAMUSCULAR | Status: DC
  Administered 2019-09-05: 09:00:00 100 mg via INTRAVENOUS
  Filled 2019-09-05: qty 2

## 2019-09-05 MED ORDER — FOLIC ACID 1 MG PO TABS: 1.0000 mg | ORAL_TABLET | Freq: Every day | ORAL | Status: DC

## 2019-09-05 MED ORDER — SODIUM CHLORIDE 0.9 % IV SOLN
1.0000 mg | Freq: Every day | INTRAVENOUS | Status: DC
  Administered 2019-09-05: 1 mg via INTRAVENOUS
  Filled 2019-09-05 (×2): qty 0.2

## 2019-09-05 MED ORDER — ADULT MULTIVITAMIN W/MINERALS CH
1.0000 | ORAL_TABLET | Freq: Every day | ORAL | Status: DC
  Administered 2019-09-06 – 2019-09-09 (×4): 1 via ORAL
  Filled 2019-09-05 (×4): qty 1

## 2019-09-05 NOTE — Progress Notes (Signed)
Patient ID: Joshua Morton, male   DOB: December 17, 1995, 23 y.o.   MRN: [id number] [facility] PROGRESS NOTE  Joshua Saranristan Shayne Vana WUJ:[id number] DOB: December 17, 1995 DOA: 09/04/2019 PCP: Patient, No Pcp Per  HPI/Subjective: Patient complains of abdominal soreness.  When he is at rest its 5 out of 10 intensity when he is moving around its higher about 8 out of 10 intensity.  It is in his upper abdomen.  No nausea or vomiting.  Patient states that he seen black stool.  Also last month stop some bright red blood.  Objective: Vitals:   09/05/19 0745 09/05/19 0800  BP: (!) 142/99   Pulse: (!) 112 (!) 106  Resp:    Temp: 98.3 F (36.8 C)   SpO2: 97%     Intake/Output Summary (Last 24 hours) at 09/05/2019 1423 Last data filed at 09/05/2019 1422 Gross per 24 hour  Intake -  Output 425 ml  Net -425 ml   Filed Weights   09/04/19 0658 09/04/19 1622 09/05/19 0435  Weight: 90.7 kg 93.9 kg 93.6 kg    ROS: Review of Systems  Constitutional: Negative for chills and fever.  Eyes: Negative for blurred vision.  Respiratory: Negative for cough and shortness of breath.   Cardiovascular: Negative for chest pain.  Gastrointestinal: Positive for abdominal pain, blood in stool and nausea. Negative for constipation, diarrhea and vomiting.  Genitourinary: Negative for dysuria.  Musculoskeletal: Negative for joint pain.  Neurological: Negative for dizziness and headaches.   Exam: Physical Exam  Constitutional: He is oriented to person, place, and time.  HENT:  Nose: No mucosal edema.  Mouth/Throat: No oropharyngeal exudate or posterior oropharyngeal edema.  Eyes: Pupils are equal, round, and reactive to light. Conjunctivae, EOM and lids are normal.  Neck: No JVD present. Carotid bruit is not present. No edema present. No thyroid mass and no thyromegaly present.  Cardiovascular: S1 normal and S2 normal. Tachycardia present. Exam reveals no gallop.  No murmur heard. Pulses:      Dorsalis pedis  pulses are 2+ on the right side and 2+ on the left side.  Respiratory: No respiratory distress. He has decreased breath sounds in the right lower field and the left lower field. He has no wheezes. He has no rhonchi. He has no rales.  GI: Soft. Bowel sounds are normal. There is abdominal tenderness in the epigastric area and left upper quadrant.  Musculoskeletal:     Right ankle: He exhibits no swelling.     Left ankle: He exhibits no swelling.  Lymphadenopathy:    He has no cervical adenopathy.  Neurological: He is alert and oriented to person, place, and time. No cranial nerve deficit.  Skin: Skin is warm. No rash noted. Nails show no clubbing.  Psychiatric: He has a normal mood and affect.      Data Reviewed: Basic Metabolic Panel: Recent Labs  Lab 09/04/19 0705 09/05/19 0554  NA 139 138  K 4.0 4.0  CL 105 106  CO2 24 22  GLUCOSE 128* 100*  BUN 12 15  CREATININE 1.01 0.87  CALCIUM 8.8* 8.4*  MG 2.1 1.9  PHOS  --  3.0   Liver Function Tests: Recent Labs  Lab 09/04/19 0705 09/05/19 0554  AST 93* 47*  ALT 120* 78*  ALKPHOS 93 71  BILITOT 0.6 1.6*  PROT 8.3* 6.9  ALBUMIN 4.5 3.6   Recent Labs  Lab 09/04/19 0705 09/05/19 0554  LIPASE 2,993* 1,153*   CBC: Recent Labs  Lab 09/04/19 0705 09/05/19  0554  WBC 15.9* 15.6*  NEUTROABS  --  13.2*  HGB 17.5* 17.0  HCT 48.5 50.3  MCV 94.7 101.2*  PLT 334 244    CBG: Recent Labs  Lab 09/05/19 0557  GLUCAP 97    Recent Results (from the past 240 hour(s))  SARS CORONAVIRUS 2 (TAT 6-24 HRS) Nasopharyngeal Nasopharyngeal Swab     Status: None   Collection Time: 09/04/19  9:59 AM   Specimen: Nasopharyngeal Swab  Result Value Ref Range Status   SARS Coronavirus 2 NEGATIVE NEGATIVE Final    Comment: (NOTE) SARS-CoV-2 target nucleic acids are NOT DETECTED. The SARS-CoV-2 RNA is generally detectable in upper and lower respiratory specimens during the acute phase of infection. Negative results do not preclude  SARS-CoV-2 infection, do not rule out co-infections with other pathogens, and should not be used as the sole basis for treatment or other patient management decisions. Negative results must be combined with clinical observations, patient history, and epidemiological information. The expected result is Negative. Fact Sheet for Patients: SugarRoll.be Fact Sheet for Healthcare Providers: https://www.woods-mathews.com/ This test is not yet approved or cleared by the [place] FDA and  has been authorized for detection and/or diagnosis of SARS-CoV-2 by FDA under an Emergency Use Authorization (EUA). This EUA will remain  in effect (meaning this test can be used) for the duration of the COVID-19 declaration under Section 56 4(b)(1) of the Act, 21 U.S.C. section 360bbb-3(b)(1), unless the authorization is terminated or revoked sooner. Performed at [facility], [place] [address] [place], [place] [address]      Studies: Ct Angio Chest Pe W And/or Wo Contrast  Result Date: 09/04/2019 CLINICAL DATA:  Acute left-sided chest pain with short of breath EXAM: CT ANGIOGRAPHY CHEST WITH CONTRAST TECHNIQUE: Multidetector CT imaging of the chest was performed using the standard protocol during bolus administration of intravenous contrast. Multiplanar CT image reconstructions and MIPs were obtained to evaluate the vascular anatomy. CONTRAST:  93mL OMNIPAQUE IOHEXOL 350 MG/ML SOLN COMPARISON:  CT chest 06/25/2014 FINDINGS: Cardiovascular: Negative for pulmonary embolism. Pulmonary arteries normal in caliber. Normal aortic arch. Heart size normal. Mediastinum/Nodes: Negative for mass or adenopathy Lungs/Pleura: Lungs are well aerated and clear. No infiltrate effusion or mass. Upper Abdomen: Extensive fatty infiltration of the liver. Peripancreatic edema compatible with acute pancreatitis. Lipase level currently pending. Musculoskeletal: No focal skeletal  abnormality. Review of the MIP images confirms the above findings. IMPRESSION: Negative for pulmonary embolism.  No acute abnormality in the chest Extensive fatty infiltration of the liver. Peripancreatic edema consistent with acute pancreatitis. Electronically Signed   By: Franchot Gallo M.D.   On: 09/04/2019 08:59   US Abdomen Limited Ruq  Result Date: 09/04/2019 CLINICAL DATA:  Abdominal pain.  Pancreatitis. EXAM: ULTRASOUND ABDOMEN LIMITED RIGHT UPPER QUADRANT COMPARISON:  CT abdomen/pelvis from 06/25/2014. FINDINGS: Gallbladder: Nondistended gallbladder. No gallbladder wall thickening. No pericholecystic fluid. No sonographic Murphy sign. No gallstones. Layering sludge. Common bile duct: Diameter: 3 mm Liver: Diffusely markedly echogenic liver parenchyma with prominent posterior acoustic attenuation, compatible with diffuse hepatic steatosis. No definite liver surface irregularity. No liver mass is demonstrated, noting significantly decreased sensitivity in the setting of a markedly echogenic liver. Portal vein is patent on color Doppler imaging with normal direction of blood flow towards the liver. Other: None. IMPRESSION: 1. Marked diffuse hepatic steatosis. 2. Gallbladder sludge.  No cholelithiasis. 3. No biliary ductal dilatation. Electronically Signed   By: Ilona Sorrel M.D.   On: 09/04/2019 12:06  Scheduled Meds: . folic acid  1 mg Oral Daily  . multivitamin with minerals  1 tablet Oral Daily  . pantoprazole (PROTONIX) IV  40 mg Intravenous Q12H  . thiamine injection  100 mg Intravenous Daily   Or  . thiamine  100 mg Oral Daily   Continuous Infusions: . folic acid (FOLVITE) IVPB 1 mg (09/05/19 1006)  . lactated ringers 100 mL/hr at 09/05/19 0850  . ondansetron [facility]) IV 8 mg (09/04/19 1858)    Assessment/Plan:  1. Acute pancreatitis with history of alcohol abuse.  Alcohol cessation is needed.  As needed pain and nausea control.  Advised to take oral pain medication.  Start  clear liquid diet.  Lipase trending better today.  Triglycerides normal range.  Ultrasound of the right upper quadrant does show some gallbladder sludge but no stones. 2. Alcohol abuse.  Patient put on alcohol withdrawal protocol. 3. Patient has seen black stools and blood per rectum.  Hemoglobin stable.  Patient on IV Protonix.  Continue to monitor for now. 4. Hypertension and tachycardia.  Continue to monitor.  IV fluid hydration. 5. Leukocytosis likely reactive.  Continue to monitor.  Code Status:     Code Status Orders  (From admission, onward)         Start     Ordered   09/04/19 1107  Full code  Continuous     09/04/19 1117        Code Status History    This patient has a current code status but no historical code status.   Advance Care Planning Activity     Family Communication: Permission to speak in front of visitor at the bedside Disposition Plan: To be determined  Time spent: 34 minutes  Robbi Scurlock [employer]

## 2019-09-06 ENCOUNTER — Inpatient Hospital Stay: Payer: BC Managed Care – PPO

## 2019-09-06 DIAGNOSIS — K852 Alcohol induced acute pancreatitis without necrosis or infection: Principal | ICD-10-CM

## 2019-09-06 DIAGNOSIS — J9601 Acute respiratory failure with hypoxia: Secondary | ICD-10-CM

## 2019-09-06 DIAGNOSIS — J181 Lobar pneumonia, unspecified organism: Secondary | ICD-10-CM

## 2019-09-06 LAB — CBC
HCT: 42 % (ref 39.0–52.0)
Hemoglobin: 15.1 g/dL (ref 13.0–17.0)
MCH: 34.9 pg — ABNORMAL HIGH (ref 26.0–34.0)
MCHC: 36 g/dL (ref 30.0–36.0)
MCV: 97 fL (ref 80.0–100.0)
Platelets: 170 10*3/uL (ref 150–400)
RBC: 4.33 MIL/uL (ref 4.22–5.81)
RDW: 12 % (ref 11.5–15.5)
WBC: 12.5 10*3/uL — ABNORMAL HIGH (ref 4.0–10.5)
nRBC: 0 % (ref 0.0–0.2)

## 2019-09-06 LAB — LIPASE, BLOOD: Lipase: 295 U/L — ABNORMAL HIGH (ref 11–51)

## 2019-09-06 LAB — PHOSPHORUS: Phosphorus: 2.2 mg/dL — ABNORMAL LOW (ref 2.5–4.6)

## 2019-09-06 LAB — GAMMA GT: GGT: 105 U/L — ABNORMAL HIGH (ref 7–50)

## 2019-09-06 LAB — MONONUCLEOSIS SCREEN: Mono Screen: NEGATIVE

## 2019-09-06 MED ORDER — K PHOS MONO-SOD PHOS DI & MONO 155-852-130 MG PO TABS
500.0000 mg | ORAL_TABLET | ORAL | Status: AC
  Administered 2019-09-06 – 2019-09-07 (×4): 500 mg via ORAL
  Filled 2019-09-06 (×4): qty 2

## 2019-09-06 MED ORDER — PIPERACILLIN-TAZOBACTAM 3.375 G IVPB
3.3750 g | Freq: Three times a day (TID) | INTRAVENOUS | Status: DC
  Administered 2019-09-06 – 2019-09-08 (×6): 3.375 g via INTRAVENOUS
  Filled 2019-09-06 (×6): qty 50

## 2019-09-06 MED ORDER — AZITHROMYCIN 250 MG PO TABS
250.0000 mg | ORAL_TABLET | Freq: Every day | ORAL | Status: DC
  Administered 2019-09-07 – 2019-09-08 (×2): 250 mg via ORAL
  Filled 2019-09-06 (×2): qty 1

## 2019-09-06 MED ORDER — AZITHROMYCIN 250 MG PO TABS
500.0000 mg | ORAL_TABLET | Freq: Every day | ORAL | Status: AC
  Administered 2019-09-06: 15:00:00 500 mg via ORAL
  Filled 2019-09-06: qty 2

## 2019-09-06 MED ORDER — IPRATROPIUM-ALBUTEROL 0.5-2.5 (3) MG/3ML IN SOLN
3.0000 mL | Freq: Four times a day (QID) | RESPIRATORY_TRACT | Status: DC
  Administered 2019-09-06 – 2019-09-07 (×5): 3 mL via RESPIRATORY_TRACT
  Filled 2019-09-06 (×5): qty 3

## 2019-09-06 MED ORDER — IBUPROFEN 400 MG PO TABS
400.0000 mg | ORAL_TABLET | Freq: Four times a day (QID) | ORAL | Status: DC | PRN
  Administered 2019-09-06 – 2019-09-07 (×2): 400 mg via ORAL
  Filled 2019-09-06 (×2): qty 1

## 2019-09-06 MED ORDER — SODIUM CHLORIDE 0.9 % IV SOLN
INTRAVENOUS | Status: DC | PRN
  Administered 2019-09-06 – 2019-09-08 (×3): 250 mL via INTRAVENOUS

## 2019-09-06 NOTE — Plan of Care (Signed)
  Problem: Education: Goal: Knowledge of General Education information will improve Description: Including pain rating scale, medication(s)/side effects and non-pharmacologic comfort measures Outcome: Progressing   Problem: Education: Goal: Knowledge of Pancreatitis treatment and prevention will improve Outcome: Progressing   Problem: Health Behavior/Discharge Planning: Goal: Ability to manage health-related needs will improve Outcome: Not Progressing   Problem: Clinical Measurements: Goal: Will remain free from infection Outcome: Not Progressing Note: Patient had an elevated temperature and WBC

## 2019-09-06 NOTE — Consult Note (Signed)
Pharmacy Antibiotic Note  Michaela Shankel is a 23 y.o. male admitted on 09/04/2019 with pneumonia.  Pharmacy has been consulted for Pip/tazo dosing.  Plan: Zosyn 3.375g IV q8h (4 hour infusion).  Height: 5\' 11"  (180.3 cm) Weight: 210 lb 11.2 oz (95.6 kg)(pt did not want to stand ) IBW/kg (Calculated) : 75.3  Temp (24hrs), Avg:99.4 F (37.4 C), Min:99 F (37.2 C), Max:99.7 F (37.6 C)  Recent Labs  Lab 09/04/19 0705 09/05/19 0554 09/06/19 0632  WBC 15.9* 15.6* 12.5*  CREATININE 1.01 0.87  --     Estimated Creatinine Clearance: 154.4 mL/min (by C-G formula based on SCr of 0.87 mg/dL).    No Known Allergies  Antimicrobials this admission: 11/16 Pip/tazp >>  11/16 azithromcyin >>   Dose adjustments this admission: None  Microbiology results: None  Thank you for allowing pharmacy to be a part of this patient's care.  Oswald Hillock, PharmD, BCPS 09/06/2019 2:19 PM

## 2019-09-06 NOTE — Progress Notes (Signed)
Patient ID: Joshua Morton, male   DOB: September 25, 1996, 23 y.o.   MRN: [id number] [facility] PROGRESS NOTE  Joshua Saranristan Shayne Joshua Morton QVZ:[id number] DOB: September 25, 1996 DOA: 09/04/2019 PCP: Patient, No Pcp Per  HPI/Subjective: Patient still having abdominal pain.  Tolerated clear liquids.  Needed to be put on oxygen last night.  Some shortness of breath.  No cough.  Some nausea.  Objective: Vitals:   09/06/19 0855 09/06/19 1311  BP:    Pulse:    Resp:    Temp:    SpO2: 93% 96%    Intake/Output Summary (Last 24 hours) at 09/06/2019 1426 Last data filed at 09/06/2019 0700 Gross per 24 hour  Intake 3367.35 ml  Output 400 ml  Net 2967.35 ml   Filed Weights   09/04/19 1622 09/05/19 0435 09/06/19 0357  Weight: 93.9 kg 93.6 kg 95.6 kg    ROS: Review of Systems  Constitutional: Negative for chills and fever.  Eyes: Negative for blurred vision.  Respiratory: Positive for shortness of breath. Negative for cough.   Cardiovascular: Negative for chest pain.  Gastrointestinal: Positive for abdominal pain and nausea. Negative for constipation, diarrhea and vomiting.  Genitourinary: Negative for dysuria.  Musculoskeletal: Negative for joint pain.  Neurological: Negative for dizziness and headaches.   Exam: Physical Exam  Constitutional: He is oriented to person, place, and time.  HENT:  Nose: No mucosal edema.  Mouth/Throat: No oropharyngeal exudate or posterior oropharyngeal edema.  Eyes: Pupils are equal, round, and reactive to light. Conjunctivae, EOM and lids are normal.  Neck: No JVD present. Carotid bruit is not present. No edema present. No thyroid mass and no thyromegaly present.  Cardiovascular: S1 normal and S2 normal. Tachycardia present. Exam reveals no gallop.  No murmur heard. Pulses:      Dorsalis pedis pulses are 2+ on the right side and 2+ on the left side.  Respiratory: No respiratory distress. He has decreased breath sounds in the right lower field and the left  lower field. He has no wheezes. He has no rhonchi. He has no rales.  GI: Soft. Bowel sounds are normal. There is abdominal tenderness in the epigastric area and left upper quadrant.  Musculoskeletal:     Right ankle: He exhibits no swelling.     Left ankle: He exhibits no swelling.  Lymphadenopathy:    He has no cervical adenopathy.  Neurological: He is alert and oriented to person, place, and time. No cranial nerve deficit.  Skin: Skin is warm. No rash noted. Nails show no clubbing.  Psychiatric: He has a normal mood and affect.      Data Reviewed: Basic Metabolic Panel: Recent Labs  Lab 09/04/19 0705 09/05/19 0554 09/06/19 0632  NA 139 138  --   K 4.0 4.0  --   CL 105 106  --   CO2 24 22  --   GLUCOSE 128* 100*  --   BUN 12 15  --   CREATININE 1.01 0.87  --   CALCIUM 8.8* 8.4*  --   MG 2.1 1.9  --   PHOS  --  3.0 2.2*   Liver Function Tests: Recent Labs  Lab 09/04/19 0705 09/05/19 0554  AST 93* 47*  ALT 120* 78*  ALKPHOS 93 71  BILITOT 0.6 1.6*  PROT 8.3* 6.9  ALBUMIN 4.5 3.6   Recent Labs  Lab 09/04/19 0705 09/05/19 0554 09/06/19 0632  LIPASE 2,993* 1,153* 295*   CBC: Recent Labs  Lab 09/04/19 0705 09/05/19 0554 09/06/19 [id number]  WBC 15.9* 15.6* 12.5*  NEUTROABS  --  13.2*  --   HGB 17.5* 17.0 15.1  HCT 48.5 50.3 42.0  MCV 94.7 101.2* 97.0  PLT 334 244 170    CBG: Recent Labs  Lab 09/05/19 0557  GLUCAP 97    Recent Results (from the past 240 hour(s))  SARS CORONAVIRUS 2 (TAT 6-24 HRS) Nasopharyngeal Nasopharyngeal Swab     Status: None   Collection Time: 09/04/19  9:59 AM   Specimen: Nasopharyngeal Swab  Result Value Ref Range Status   SARS Coronavirus 2 NEGATIVE NEGATIVE Final    Comment: (NOTE) SARS-CoV-2 target nucleic acids are NOT DETECTED. The SARS-CoV-2 RNA is generally detectable in upper and lower respiratory specimens during the acute phase of infection. Negative results do not preclude SARS-CoV-2 infection, do not rule  out co-infections with other pathogens, and should not be used as the sole basis for treatment or other patient management decisions. Negative results must be combined with clinical observations, patient history, and epidemiological information. The expected result is Negative. Fact Sheet for Patients: SugarRoll.be Fact Sheet for Healthcare Providers: https://www.woods-mathews.com/ This test is not yet approved or cleared by the [place] FDA and  has been authorized for detection and/or diagnosis of SARS-CoV-2 by FDA under an Emergency Use Authorization (EUA). This EUA will remain  in effect (meaning this test can be used) for the duration of the COVID-19 declaration under Section 56 4(b)(1) of the Act, 21 U.S.C. section 360bbb-3(b)(1), unless the authorization is terminated or revoked sooner. Performed at [facility], [place] [address] [place], [place] [address]      Studies: Dg Chest 2 View  Result Date: 09/06/2019 CLINICAL DATA:  Hypoxia. Abdominal pain. EXAM: CHEST - 2 VIEW COMPARISON:  CTA chest 09/04/2019. FINDINGS: The heart size is exaggerated by low lung volumes. Left lower lobe airspace disease and effusion is present. There is minimal atelectasis at the right base. IMPRESSION: 1. Left lower lobe airspace disease and effusion compatible with pneumonia. 2. Minimal atelectasis at the right base. 3. Low lung volumes. Electronically Signed   By: San Morelle M.D.   On: 09/06/2019 11:34    Scheduled Meds: . azithromycin  500 mg Oral Daily   Followed by  . [START ON 09/07/2019] azithromycin  250 mg Oral Daily  . folic acid  1 mg Oral Daily  . ipratropium-albuterol  3 mL Nebulization Q6H  . multivitamin with minerals  1 tablet Oral Daily  . pantoprazole (PROTONIX) IV  40 mg Intravenous Q12H  . phosphorus  500 mg Oral Q4H  . thiamine injection  100 mg Intravenous Daily   Or  . thiamine  100 mg Oral Daily    Continuous Infusions: . folic acid (FOLVITE) IVPB 1 mg (09/05/19 1006)  . lactated ringers 100 mL/hr at 09/06/19 1422  . ondansetron [facility]) IV 8 mg (09/04/19 1858)  . piperacillin-tazobactam (ZOSYN)  IV      Assessment/Plan:  1. Acute pancreatitis with history of alcohol abuse.  Alcohol cessation is needed.  As needed pain and nausea control.  Advised to take oral pain medication. Advance to full liquid diet.  Lipase down to 295 today.  Triglycerides normal range.  Ultrasound of the right upper quadrant does show some gallbladder sludge but no stones. 2. Acute hypoxic respiratory failure.  Patient placed on oxygen.  Checks x-ray concerning for left lower lobar pneumonia.  Start Zosyn and Zithromax.  Oxygen supplementation 3. Alcohol abuse.  No signs of withdrawal. 4. Patient has  seen black stools and blood per rectum.  Patient on IV Protonix.  Hemoglobin still stable.  Guaiac stool when he has a bowel movement. 5. Tachycardia.  Likely secondary to pain and now potentially pneumonia  Code Status:     Code Status Orders  (From admission, onward)         Start     Ordered   09/04/19 1107  Full code  Continuous     09/04/19 1117        Code Status History    This patient has a current code status but no historical code status.   Advance Care Planning Activity     Family Communication: Permission to call girlfriend on the phone.  Advised her that FMLA paperwork is filled out upon discharge this way we know when he can go back to work. Disposition Plan: To be determined  Time spent: 28 minutes  Neyah Ellerman [employer]

## 2019-09-06 NOTE — Progress Notes (Signed)
Patient has a low grade temperature.

## 2019-09-06 NOTE — Progress Notes (Signed)
Patient ID: Joshua Morton  Patient was admitted to [facility] on 09/04/2019.  He is still currently in the hospital as of 09/06/2019.  The FMLA paperwork will be filled out upon discharge from the hospital.  Please excuse from work while he is undergoing treatment.  Dr Loletha Grayer [facility]

## 2019-09-07 ENCOUNTER — Inpatient Hospital Stay (HOSPITAL_COMMUNITY)
Admit: 2019-09-07 | Discharge: 2019-09-07 | Disposition: A | Discharge status: Home / Self Care | Payer: BC Managed Care – PPO | Attending: Internal Medicine | Admitting: Internal Medicine

## 2019-09-07 DIAGNOSIS — I361 Nonrheumatic tricuspid (valve) insufficiency: Secondary | ICD-10-CM

## 2019-09-07 LAB — PHOSPHORUS: Phosphorus: 4 mg/dL (ref 2.5–4.6)

## 2019-09-07 LAB — EPSTEIN-BARR VIRUS (EBV) ANTIBODY PROFILE
EBV NA IgG: 289 U/mL — ABNORMAL HIGH (ref 0.0–17.9)
EBV VCA IgG: 296 U/mL — ABNORMAL HIGH (ref 0.0–17.9)
EBV VCA IgM: <36 U/mL (ref 0.0–35.9)

## 2019-09-07 LAB — CBC
HCT: 41.1 % (ref 39.0–52.0)
Hemoglobin: 14 g/dL (ref 13.0–17.0)
MCH: 33.7 pg (ref 26.0–34.0)
MCHC: 34.1 g/dL (ref 30.0–36.0)
MCV: 99 fL (ref 80.0–100.0)
Platelets: 173 10*3/uL (ref 150–400)
RBC: 4.15 MIL/uL — ABNORMAL LOW (ref 4.22–5.81)
RDW: 12 % (ref 11.5–15.5)
WBC: 9.7 10*3/uL (ref 4.0–10.5)
nRBC: 0 % (ref 0.0–0.2)

## 2019-09-07 LAB — COMPREHENSIVE METABOLIC PANEL
ALT: 49 U/L — ABNORMAL HIGH (ref 0–44)
AST: 39 U/L (ref 15–41)
Albumin: 2.9 g/dL — ABNORMAL LOW (ref 3.5–5.0)
Alkaline Phosphatase: 55 U/L (ref 38–126)
Anion gap: 11 (ref 5–15)
BUN: 9 mg/dL (ref 6–20)
CO2: 28 mmol/L (ref 22–32)
Calcium: 8 mg/dL — ABNORMAL LOW (ref 8.9–10.3)
Chloride: 98 mmol/L (ref 98–111)
Creatinine, Ser: 0.95 mg/dL (ref 0.61–1.24)
GFR calc Af Amer: >60 mL/min (ref >60)
GFR calc non Af Amer: >60 mL/min (ref >60)
Glucose, Bld: 95 mg/dL (ref 70–99)
Potassium: 3.4 mmol/L — ABNORMAL LOW (ref 3.5–5.1)
Sodium: 137 mmol/L (ref 135–145)
Total Bilirubin: 2.1 mg/dL — ABNORMAL HIGH (ref 0.3–1.2)
Total Protein: 6.1 g/dL — ABNORMAL LOW (ref 6.5–8.1)

## 2019-09-07 LAB — HEPATITIS PANEL, ACUTE
HCV Ab: 0.1 s/co ratio — AB (ref 0.0–0.9)
Hep A IgM: NEGATIVE — AB
Hep B C IgM: NEGATIVE — AB
Hepatitis B Surface Ag: NEGATIVE — AB

## 2019-09-07 LAB — ECHOCARDIOGRAM COMPLETE
Height: 71 in
Weight: 3435.2 oz

## 2019-09-07 LAB — HIV ANTIBODY (ROUTINE TESTING W REFLEX): HIV Screen 4th Generation wRfx: NONREACTIVE — AB

## 2019-09-07 LAB — LIPASE, BLOOD: Lipase: 102 U/L — ABNORMAL HIGH (ref 11–51)

## 2019-09-07 MED ORDER — PANTOPRAZOLE SODIUM 40 MG PO TBEC: 40.0000 mg | DELAYED_RELEASE_TABLET | Freq: Two times a day (BID) | ORAL | Status: DC

## 2019-09-07 MED ORDER — IPRATROPIUM-ALBUTEROL 0.5-2.5 (3) MG/3ML IN SOLN
3.0000 mL | Freq: Three times a day (TID) | RESPIRATORY_TRACT | Status: DC
  Administered 2019-09-07 – 2019-09-09 (×5): 3 mL via RESPIRATORY_TRACT
  Filled 2019-09-07 (×5): qty 3

## 2019-09-07 MED ORDER — PANTOPRAZOLE SODIUM 40 MG IV SOLR
40.0000 mg | Freq: Two times a day (BID) | INTRAVENOUS | Status: DC
  Administered 2019-09-07 – 2019-09-08 (×2): 40 mg via INTRAVENOUS
  Filled 2019-09-07 (×2): qty 40

## 2019-09-07 MED ORDER — IPRATROPIUM-ALBUTEROL 0.5-2.5 (3) MG/3ML IN SOLN: 3.0000 mL | Freq: Four times a day (QID) | RESPIRATORY_TRACT | Status: DC | PRN

## 2019-09-07 MED ORDER — OXYCODONE HCL 5 MG PO TABS
10.0000 mg | ORAL_TABLET | ORAL | Status: DC | PRN
  Administered 2019-09-07 – 2019-09-09 (×7): 10 mg via ORAL
  Filled 2019-09-07 (×7): qty 2

## 2019-09-07 MED ORDER — METOPROLOL TARTRATE 5 MG/5ML IV SOLN: 5.0000 mg | INTRAVENOUS | Status: DC | PRN

## 2019-09-07 MED ORDER — DILTIAZEM HCL ER COATED BEADS 120 MG PO CP24
120.0000 mg | ORAL_CAPSULE | Freq: Every day | ORAL | Status: DC
  Administered 2019-09-07 – 2019-09-09 (×3): 120 mg via ORAL
  Filled 2019-09-07 (×3): qty 1

## 2019-09-07 MED ORDER — FUROSEMIDE 10 MG/ML IJ SOLN
40.0000 mg | Freq: Once | INTRAMUSCULAR | Status: AC
  Administered 2019-09-07: 15:00:00 40 mg via INTRAVENOUS
  Filled 2019-09-07: qty 4

## 2019-09-07 NOTE — Progress Notes (Signed)
MD notified of patients HR maintaining 120-145. Pt was sustained in 140s. MD orders cardizem PO and prn metorpolol for HR >130. I will continue to assess.

## 2019-09-07 NOTE — Progress Notes (Signed)
*  PRELIMINARY RESULTS* Echocardiogram 2D Echocardiogram has been performed.  Sherrie Sport 09/07/2019, 2:33 PM

## 2019-09-07 NOTE — Progress Notes (Signed)
MD is aware of HR being elevated. MD placed order for cardizem and prn metoprolol IV if HR is > 130. The patient has been maintaining in 110-120s but the patient's HR will elevate when patient is at [place]. I will continue to assess.

## 2019-09-07 NOTE — Progress Notes (Addendum)
PHARMACIST - PHYSICIAN COMMUNICATION  DR:   Leslye Peer  CONCERNING: IV to Oral Route Change Policy  RECOMMENDATION: This patient is receiving thiamine/folic acid by the intravenous route.  Based on criteria approved by the Pharmacy and Therapeutics Committee, the intravenous medication(s) is/are being converted to the equivalent oral dose form(s).   DESCRIPTION: These criteria include:  The patient is eating (either orally or via tube) and/or has been taking other orally administered medications for a least 24 hours  The patient has no evidence of active gastrointestinal bleeding or impaired GI absorption (gastrectomy, short bowel, patient on TNA or NPO).  If you have questions about this conversion, please contact the Pharmacy Department   [x]   [phone] )  [place], PharmD, BCPS Clinical Pharmacist 09/07/2019 11:36 AM

## 2019-09-07 NOTE — Progress Notes (Signed)
SATURATION QUALIFICATIONS: (This note is used to comply with regulatory documentation for home oxygen)  Patient Saturations on Room Air at Rest = 88%  Patient Saturations on Room Air while Ambulating = n/a%  Patient Saturations on 1 Liters of oxygen while Ambulating = 92%  Please briefly explain why patient needs home oxygen:

## 2019-09-07 NOTE — Progress Notes (Signed)
Patient ID: Joshua Morton, male   DOB: 08/20/1996, 23 y.o.   MRN: [id number] [facility] PROGRESS NOTE  Joshua Morton POE:[id number] DOB: 1996/08/28 DOA: 09/04/2019 PCP: Patient, No Pcp Per  HPI/Subjective: Patient still having abdominal pain.  Worse when he is moving around.  Some shortness of breath.  Not much cough.  Low-grade fever.  Had a bowel movement today but no blood in it.  Objective: Vitals:   09/07/19 1033 09/07/19 1301  BP:    Pulse:  (!) 119  Resp:    Temp:    SpO2: 93%     Intake/Output Summary (Last 24 hours) at 09/07/2019 1315 Last data filed at 09/07/2019 0900 Gross per 24 hour  Intake 2770.72 ml  Output 1050 ml  Net 1720.72 ml   Filed Weights   09/05/19 0435 09/06/19 0357 09/07/19 0433  Weight: 93.6 kg 95.6 kg 97.4 kg    ROS: Review of Systems  Constitutional: Negative for chills and fever.  Eyes: Negative for blurred vision.  Respiratory: Positive for shortness of breath. Negative for cough.   Cardiovascular: Negative for chest pain.  Gastrointestinal: Positive for abdominal pain and nausea. Negative for constipation, diarrhea and vomiting.  Genitourinary: Negative for dysuria.  Musculoskeletal: Negative for joint pain.  Neurological: Negative for dizziness and headaches.   Exam: Physical Exam  Constitutional: He is oriented to person, place, and time.  HENT:  Nose: No mucosal edema.  Mouth/Throat: No oropharyngeal exudate or posterior oropharyngeal edema.  Eyes: Pupils are equal, round, and reactive to light. Conjunctivae, EOM and lids are normal.  Neck: No JVD present. Carotid bruit is not present. No edema present. No thyroid mass and no thyromegaly present.  Cardiovascular: S1 normal and S2 normal. Tachycardia present. Exam reveals no gallop.  No murmur heard. Pulses:      Dorsalis pedis pulses are 2+ on the right side and 2+ on the left side.  Respiratory: No respiratory distress. He has decreased breath sounds in the  right lower field and the left lower field. He has no wheezes. He has no rhonchi. He has no rales.  GI: Soft. Bowel sounds are normal. There is abdominal tenderness in the epigastric area and left upper quadrant.  Musculoskeletal:     Right ankle: He exhibits no swelling.     Left ankle: He exhibits no swelling.  Lymphadenopathy:    He has no cervical adenopathy.  Neurological: He is alert and oriented to person, place, and time. No cranial nerve deficit.  Skin: Skin is warm. No rash noted. Nails show no clubbing.  Psychiatric: He has a normal mood and affect.      Data Reviewed: Basic Metabolic Panel: Recent Labs  Lab 09/04/19 0705 09/05/19 0554 09/06/19 0632 09/07/19 0421  NA 139 138  --  137  K 4.0 4.0  --  3.4*  CL 105 106  --  98  CO2 24 22  --  28  GLUCOSE 128* 100*  --  95  BUN 12 15  --  9  CREATININE 1.01 0.87  --  0.95  CALCIUM 8.8* 8.4*  --  8.0*  MG 2.1 1.9  --   --   PHOS  --  3.0 2.2* 4.0   Liver Function Tests: Recent Labs  Lab 09/04/19 0705 09/05/19 0554 09/07/19 0421  AST 93* 47* 39  ALT 120* 78* 49*  ALKPHOS 93 71 55  BILITOT 0.6 1.6* 2.1*  PROT 8.3* 6.9 6.1*  ALBUMIN 4.5 3.6 2.9*  Recent Labs  Lab 09/04/19 0705 09/05/19 0554 09/06/19 0632 09/07/19 0421  LIPASE 2,993* 1,153* 295* 102*   CBC: Recent Labs  Lab 09/04/19 0705 09/05/19 0554 09/06/19 0632 09/07/19 0421  WBC 15.9* 15.6* 12.5* 9.7  NEUTROABS  --  13.2*  --   --   HGB 17.5* 17.0 15.1 14.0  HCT 48.5 50.3 42.0 41.1  MCV 94.7 101.2* 97.0 99.0  PLT 334 244 170 173    CBG: Recent Labs  Lab 09/05/19 0557  GLUCAP 97    Recent Results (from the past 240 hour(s))  SARS CORONAVIRUS 2 (TAT 6-24 HRS) Nasopharyngeal Nasopharyngeal Swab     Status: None   Collection Time: 09/04/19  9:59 AM   Specimen: Nasopharyngeal Swab  Result Value Ref Range Status   SARS Coronavirus 2 NEGATIVE NEGATIVE Final    Comment: (NOTE) SARS-CoV-2 target nucleic acids are NOT DETECTED. The  SARS-CoV-2 RNA is generally detectable in upper and lower respiratory specimens during the acute phase of infection. Negative results do not preclude SARS-CoV-2 infection, do not rule out co-infections with other pathogens, and should not be used as the sole basis for treatment or other patient management decisions. Negative results must be combined with clinical observations, patient history, and epidemiological information. The expected result is Negative. Fact Sheet for Patients: HairSlick.nohttps://www.fda.gov/media/138098/download Fact Sheet for Healthcare Providers: quierodirigir.comhttps://www.fda.gov/media/138095/download This test is not yet approved or cleared by the [place] FDA and  has been authorized for detection and/or diagnosis of SARS-CoV-2 by FDA under an Emergency Use Authorization (EUA). This EUA will remain  in effect (meaning this test can be used) for the duration of the COVID-19 declaration under Section 56 4(b)(1) of the Act, 21 U.S.C. section 360bbb-3(b)(1), unless the authorization is terminated or revoked sooner. Performed at [facility], [address] [place], [place] [address]      Studies: Dg Chest 2 View  Result Date: 09/06/2019 CLINICAL DATA:  Hypoxia. Abdominal pain. EXAM: CHEST - 2 VIEW COMPARISON:  CTA chest 09/04/2019. FINDINGS: The heart size is exaggerated by low lung volumes. Left lower lobe airspace disease and effusion is present. There is minimal atelectasis at the right base. IMPRESSION: 1. Left lower lobe airspace disease and effusion compatible with pneumonia. 2. Minimal atelectasis at the right base. 3. Low lung volumes. Electronically Signed   By: Marin Robertshristopher  Mattern M.D.   On: 09/06/2019 11:34    Scheduled Meds: . azithromycin  250 mg Oral Daily  . diltiazem  120 mg Oral Daily  . folic acid  1 mg Oral Daily  . ipratropium-albuterol  3 mL Nebulization Q6H  . multivitamin with minerals  1 tablet Oral Daily  . pantoprazole (PROTONIX) IV  40 mg  Intravenous Q12H  . thiamine  100 mg Oral Daily   Continuous Infusions: . sodium chloride 250 mL (09/07/19 0540)  . lactated ringers 100 mL/hr at 09/07/19 0127  . ondansetron (ZOFRAN) IV Stopped (09/04/19 1912)  . piperacillin-tazobactam (ZOSYN)  IV 3.375 g (09/07/19 0541)    Assessment/Plan:  1. Acute pancreatitis with history of alcohol abuse.  Alcohol cessation is needed.  As needed pain and nausea control.  Advised to take oral pain medication. Continue full liquid diet.  Lipase down to 102 today.  Hopefully pain will start to settle down tomorrow.  Triglycerides normal range.  Ultrasound of the right upper quadrant does show some gallbladder sludge but no stones.  Drop rate of IV fluids down. 2. Acute hypoxic respiratory failure.  Patient placed on  oxygen.  Chest x-ray concerning for left lower lobar pneumonia.  Started  on  Zosyn and Zithromax on 09/06/2019.  Continue oxygen supplementation.  Give 1 dose of Lasix. 3. Sinus tachycardia with heart rate going up to 140.  I ordered Cardizem CD and as needed IV metoprolol.  We will get an echocardiogram.  Hopefully this is secondary to pneumonia and pain. 4. Alcohol abuse.  No signs of withdrawal. 5. Patient has seen black stools and blood per rectum.  Patient on IV Protonix.  Hemoglobin has come down with IV fluid hydration.  Guaiac stool ordered but not sent yet.  Patient was very dehydrated upon coming in and now hemoglobin 14.   Code Status:     Code Status Orders  (From admission, onward)         Start     Ordered   09/04/19 1107  Full code  Continuous     09/04/19 1117        Code Status History    This patient has a current code status but no historical code status.   Advance Care Planning Activity     Family Communication: Permission to call girlfriend on the phone, Duwayne Heck. Disposition Plan: To be determined  Time spent: 27 minutes  Elisea Khader [employer]

## 2019-09-08 DIAGNOSIS — K219 Gastro-esophageal reflux disease without esophagitis: Secondary | ICD-10-CM

## 2019-09-08 LAB — BASIC METABOLIC PANEL
Anion gap: 15 (ref 5–15)
BUN: 7 mg/dL (ref 6–20)
CO2: 31 mmol/L (ref 22–32)
Calcium: 8.2 mg/dL — ABNORMAL LOW (ref 8.9–10.3)
Chloride: 93 mmol/L — ABNORMAL LOW (ref 98–111)
Creatinine, Ser: 0.94 mg/dL (ref 0.61–1.24)
GFR calc Af Amer: >60 mL/min (ref >60)
GFR calc non Af Amer: >60 mL/min (ref >60)
Glucose, Bld: 90 mg/dL (ref 70–99)
Potassium: 2.9 mmol/L — ABNORMAL LOW (ref 3.5–5.1)
Sodium: 139 mmol/L (ref 135–145)

## 2019-09-08 LAB — CBC
HCT: 42.4 % (ref 39.0–52.0)
Hemoglobin: 14.4 g/dL (ref 13.0–17.0)
MCH: 33.7 pg (ref 26.0–34.0)
MCHC: 34 g/dL (ref 30.0–36.0)
MCV: 99.3 fL (ref 80.0–100.0)
Platelets: 195 10*3/uL (ref 150–400)
RBC: 4.27 MIL/uL (ref 4.22–5.81)
RDW: 12 % (ref 11.5–15.5)
WBC: 8.6 10*3/uL (ref 4.0–10.5)
nRBC: 0 % (ref 0.0–0.2)

## 2019-09-08 LAB — LIPASE, BLOOD: Lipase: 56 U/L — ABNORMAL HIGH (ref 11–51)

## 2019-09-08 LAB — MAGNESIUM: Magnesium: 2.3 mg/dL (ref 1.7–2.4)

## 2019-09-08 LAB — OCCULT BLOOD X 1 CARD TO LAB, STOOL: Fecal Occult Bld: NEGATIVE

## 2019-09-08 LAB — POTASSIUM: Potassium: 3.6 mmol/L (ref 3.5–5.1)

## 2019-09-08 MED ORDER — POTASSIUM CHLORIDE 10 MEQ/100ML IV SOLN
10.0000 meq | INTRAVENOUS | Status: DC
  Filled 2019-09-08 (×4): qty 100

## 2019-09-08 MED ORDER — PANTOPRAZOLE SODIUM 40 MG PO TBEC
40.0000 mg | DELAYED_RELEASE_TABLET | Freq: Every day | ORAL | Status: DC
  Administered 2019-09-09: 40 mg via ORAL
  Filled 2019-09-08: qty 1

## 2019-09-08 MED ORDER — AMOXICILLIN-POT CLAVULANATE 875-125 MG PO TABS
1.0000 | ORAL_TABLET | Freq: Two times a day (BID) | ORAL | Status: DC
  Administered 2019-09-08 – 2019-09-09 (×3): 1 via ORAL
  Filled 2019-09-08 (×3): qty 1

## 2019-09-08 MED ORDER — POTASSIUM CHLORIDE CRYS ER 20 MEQ PO TBCR
40.0000 meq | EXTENDED_RELEASE_TABLET | Freq: Once | ORAL | Status: AC
  Administered 2019-09-08: 40 meq via ORAL
  Filled 2019-09-08: qty 2

## 2019-09-08 MED ORDER — POTASSIUM CHLORIDE CRYS ER 20 MEQ PO TBCR
40.0000 meq | EXTENDED_RELEASE_TABLET | Freq: Once | ORAL | Status: AC
  Administered 2019-09-08: 13:00:00 40 meq via ORAL
  Filled 2019-09-08: qty 2

## 2019-09-08 MED ORDER — POTASSIUM CHLORIDE CRYS ER 20 MEQ PO TBCR: 40.0000 meq | EXTENDED_RELEASE_TABLET | Freq: Once | ORAL | Status: DC

## 2019-09-08 MED ORDER — ONDANSETRON HCL 4 MG/2ML IJ SOLN
4.0000 mg | Freq: Four times a day (QID) | INTRAMUSCULAR | Status: DC | PRN
  Administered 2019-09-08: 4 mg via INTRAVENOUS
  Filled 2019-09-08: qty 2

## 2019-09-08 NOTE — Consult Note (Addendum)
PHARMACY CONSULT NOTE - FOLLOW UP  Pharmacy Consult for Electrolyte Monitoring and Replacement   Recent Labs: Potassium (mmol/L)  Date Value  09/08/2019 2.9 (L)   Magnesium (mg/dL)  Date Value  09/05/2019 1.9   Calcium (mg/dL)  Date Value  09/08/2019 8.2 (L)   Albumin (g/dL)  Date Value  09/07/2019 2.9 (L)   Phosphorus (mg/dL)  Date Value  09/07/2019 4.0   Sodium (mmol/L)  Date Value  09/08/2019 139    Assessment: 23 yo male with acute pancreatitis no longer NPO.  Pharmacy has been consulted to monitor and replenish electrolytes.  Potassium has dropped in past 4 days 4.0-->3.4>2.9    Goal of Therapy:  Electrolytes wnl's  Plan:  Will check magnesium as add-on lab and replenish Mg if needed.  Will give KCL po 35meq x 2 and recheck potassium level @ 1800 per protocol.  Addendum: Mg wnl's - changed 2nd 78meq KCL dosing to IV runs x 4 per reported gagging and will f/u lab @1500   Joshua Morton, PharmD, BCPS Clinical Pharmacist 09/08/2019 8:30 AM

## 2019-09-08 NOTE — Plan of Care (Signed)
  Problem: Pain Managment: Goal: General experience of comfort will improve Outcome: Not Progressing Note: Patient is requesting pain medication q1h. Spoke with Rufina Falco NP about patient's orders and activity. New orders were given. Discussed plan of care with patient.

## 2019-09-08 NOTE — Progress Notes (Signed)
[facility]  - [place] at [facility]   PATIENT NAME: Joshua Morton    MR#:  [id number]  DATE OF BIRTH:  1996-07-29  SUBJECTIVE:  patient tolerating clear liquid diet. Had some dry heaves this morning. Going to try full liquid diet. No abdominal pain or any significant vomiting. No Shortness of breath. No chest pain. REVIEW OF SYSTEMS:   Review of Systems  Constitutional: Negative for chills, fever and weight loss.  HENT: Negative for ear discharge, ear pain and nosebleeds.   Eyes: Negative for blurred vision, pain and discharge.  Respiratory: Negative for sputum production, shortness of breath, wheezing and stridor.   Cardiovascular: Negative for chest pain, palpitations, orthopnea and PND.  Gastrointestinal: Negative for abdominal pain, diarrhea, nausea and vomiting.  Genitourinary: Negative for frequency and urgency.  Musculoskeletal: Negative for back pain and joint pain.  Neurological: Positive for weakness. Negative for sensory change, speech change and focal weakness.  Psychiatric/Behavioral: Negative for depression and hallucinations. The patient is not nervous/anxious.    Tolerating Diet: clear liquid--- to full liquid today Tolerating PT: patient is ambulatory  DRUG ALLERGIES:  No Known Allergies  VITALS:  Blood pressure 133/87, pulse (!) 116, temperature 98.6 F (37 C), temperature source Oral, resp. rate 20, height 5\' 11"  (1.803 m), weight 92.6 kg, SpO2 92 %.  PHYSICAL EXAMINATION:   Physical Exam  GENERAL:  23 y.o.-year-old patient lying in the bed with no acute distress.  EYES: Pupils equal, round, reactive to light and accommodation. No scleral icterus. Extraocular muscles intact.  HEENT: Head atraumatic, normocephalic. Oropharynx and nasopharynx clear.  NECK:  Supple, no jugular venous distention. No thyroid enlargement, no tenderness.  LUNGS: Normal breath sounds bilaterally, no wheezing, rales, rhonchi. No use of accessory muscles of  respiration.  CARDIOVASCULAR: S1, S2 normal. No murmurs, rubs, or gallops.  ABDOMEN: Soft, nontender, nondistended. Bowel sounds present. No organomegaly or mass.  EXTREMITIES: No cyanosis, clubbing or edema b/l.    NEUROLOGIC: Cranial nerves II through XII are intact. No focal Motor or sensory deficits b/l. No tremors PSYCHIATRIC:  patient is alert and oriented x 3.  SKIN: No obvious rash, lesion, or ulcer.   LABORATORY PANEL:  CBC Recent Labs  Lab 09/08/19 0440  WBC 8.6  HGB 14.4  HCT 42.4  PLT 195    Chemistries  Recent Labs  Lab 09/07/19 0421 09/08/19 0440  NA 137 139  K 3.4* 2.9*  CL 98 93*  CO2 28 31  GLUCOSE 95 90  BUN 9 7  CREATININE 0.95 0.94  CALCIUM 8.0* 8.2*  MG  --  2.3  AST 39  --   ALT 49*  --   ALKPHOS 55  --   BILITOT 2.1*  --    Cardiac Enzymes No results for input(s): TROPONINI in the last 168 hours. RADIOLOGY:  Dg Chest 2 View  Result Date: 09/06/2019 CLINICAL DATA:  Hypoxia. Abdominal pain. EXAM: CHEST - 2 VIEW COMPARISON:  CTA chest 09/04/2019. FINDINGS: The heart size is exaggerated by low lung volumes. Left lower lobe airspace disease and effusion is present. There is minimal atelectasis at the right base. IMPRESSION: 1. Left lower lobe airspace disease and effusion compatible with pneumonia. 2. Minimal atelectasis at the right base. 3. Low lung volumes. Electronically Signed   By: 09/06/2019 M.D.   On: 09/06/2019 11:34   ASSESSMENT AND PLAN:  Joshua Morton is a 23 y.o. male with medical history significant of alcohol abuse seen in the emergency room  for abdominal pain, located midepigastrium left-sided of the abdomen and radiating to the left chest wall and left neck.  10/10, sharp pain, constant pain, associated with nausea and vomiting  1. Acute pancreatitis with history of alcohol abuse.   -Alcohol cessation is needed.   -As needed pain and nausea control. -  Advised to take oral pain medication.  -Continue full liquid  diet advance to reg diet later today  -Lipase down to 102 today.   -  Triglycerides normal range.   -Ultrasound of the right upper quadrant does show some gallbladder sludge but no stones.   -d/c IVF. -prn pain meds  2. Acute hypoxic respiratory failure due to left lower lobe pneumonia  - Patient placed on oxygen. -Sats 96% on 1 L--- wean to room air -  Chest x-ray concerning for left lower lobar pneumonia.   -Started  on  Zosyn and Zithromax on 09/06/2019-- change to oral Augmentin  3. Sinus tachycardia  heart rate now 82-116   -TTE-- shows EF of 60%. No valvular abnormality  - this is secondary to pneumonia and pain.  4. Alcohol abuse.  No signs of withdrawal.  5.Patient has seen black stools and blood per rectum.  -stool guaiac neg -advised not to take NSAIDs -oral PPI  6. Hypokalemia -replace oral and IV potassium    Family communication: waiting for girlfriend to come Consults: none Discharge Disposition: home likely today CODE STATUS: full DVT Prophylaxis:pt  is ambulatory  TOTAL TIME TAKING CARE OF THIS PATIENT: *40* minutes.  >50% time spent on counselling and coordination of care  POSSIBLE D/C IN **1* DAYS, DEPENDING ON CLINICAL CONDITION.  Note: This dictation was prepared with Dragon dictation along with smaller phrase technology. Any transcriptional errors that result from this process are unintentional.  Fritzi Mandes M.D on 09/08/2019 at 9:57 AM  Between 7am to 6pm - Pager - [phone]  After 6pm go to [email or web address]  [facility]   CC: Primary care physician; Patient, No Pcp PerPatient ID: Joshua Morton, male   DOB: 1996/06/03, 23 y.o.   MRN: [id number]

## 2019-09-08 NOTE — Progress Notes (Addendum)
12:30 PM--Patient able to maintain O2 saturations of 92-93% on room air, patient ambulated around the nurses station on room air with oxygen saturations dropping as low as 78%. MD notified, will continue to monitor patient.   16:30--Patient ambulated around nurses station O2 saturations 93% on room air at rest, oxygen saturations drop to 84% on room air.

## 2019-09-09 MED ORDER — AMOXICILLIN-POT CLAVULANATE 875-125 MG PO TABS: 1.0000 | ORAL_TABLET | Freq: Two times a day (BID) | ORAL | 0 refills | Status: AC

## 2019-09-09 MED ORDER — OXYCODONE HCL 10 MG PO TABS: 10.0000 mg | ORAL_TABLET | Freq: Four times a day (QID) | ORAL | 0 refills | Status: DC | PRN

## 2019-09-09 MED ORDER — PANTOPRAZOLE SODIUM 40 MG PO TBEC: 40.0000 mg | DELAYED_RELEASE_TABLET | Freq: Every day | ORAL | 0 refills | Status: DC

## 2019-09-09 MED ORDER — FOLIC ACID 1 MG PO TABS: 1.0000 mg | ORAL_TABLET | Freq: Every day | ORAL | 0 refills | Status: DC

## 2019-09-09 MED ORDER — THIAMINE HCL 100 MG PO TABS: 100.0000 mg | ORAL_TABLET | Freq: Every day | ORAL | 0 refills | Status: DC

## 2019-09-09 MED ORDER — ADULT MULTIVITAMIN W/MINERALS CH: 1.0000 | ORAL_TABLET | Freq: Every day | ORAL | 0 refills | Status: DC

## 2019-09-09 NOTE — Discharge Summary (Signed)
[facility] - [place] at [facility]   PATIENT NAME: Joshua Morton    MR#:  [id number]  DATE OF BIRTH:  07-26-1996  DATE OF ADMISSION:  09/04/2019 ADMITTING PHYSICIAN: Joshua CalkinEkta Aden Youngman V, MD  DATE OF DISCHARGE: 11/19 /2020  PRIMARY CARE PHYSICIAN: Joshua Morton    ADMISSION DIAGNOSIS:  Pancreatitis [K85.90] Abdominal pain [R10.9] Alcohol-induced acute pancreatitis, unspecified complication status [K85.20]  DISCHARGE DIAGNOSIS:  alcohol induced pancreatitis Tachycardia-- resolved acute hypoxic respiratory failure due to left lower lobe pneumonia  SECONDARY DIAGNOSIS:   Past Medical History:  Diagnosis Date  . Attempted suicide [facility](HCC)     HOSPITAL COURSE:   Joshua Morton a 23 y.o.malewith medical history significant ofalcohol abuse seen in the emergency room for abdominal pain, located midepigastrium left-sided of the abdomen and radiating to the left chest wall and left neck. 10/10, sharp pain, constant pain, associated with nausea and vomiting  1. Acute pancreatitis with history of alcohol abuse.  -Alcohol cessation is needed.  -As needed pain and nausea control. - Advised to take oral pain medication as needed -Continuefull liquid diet advance to reg diet/soft and tolerating well  -Lipase down to 102 - Triglycerides normal range.  -Ultrasound of the right upper quadrant does show some gallbladder sludge but no stones. -d/c IVF. -prn pain meds  2. Acute hypoxic respiratory failure due to left lower lobe pneumonia  -Patient placed on oxygen. -Sats 96% on 1 L--- wean to room air, sats are 94% on room air. Patient using incentive spirometer - Chestx-ray concerning for left lower lobar pneumonia.  -Started on Zosyn and Zithromax on 09/06/2019-- change to oral Augmentin for more days  3. Sinus tachycardia  heart rate now 82-116  -TTE-- shows EF of 60%. No valvular abnormality  - this is secondary to pneumonia and  pain. -Improved  4. Alcohol abuse. No signs of withdrawal.  5.Patient has seen black stools and blood Morton rectum.  -stool guaiac neg -advised not to take NSAIDs -oral PPI  6. Hypokalemia -replaced with  Oral potassium  overall hemodynamically stable. Patient will discharged to home. FEMLA papers filled out.  Family communication:  will friend has been informed by patient  consults: none Discharge Disposition: home today CODE STATUS: full DVT Prophylaxis:pt  is ambulatory CONSULTS OBTAINED:    DRUG ALLERGIES:  No Known Allergies  DISCHARGE MEDICATIONS:   Allergies as of 09/09/2019   No Known Allergies     Medication List    STOP taking these medications   HYDROcodone-acetaminophen 5-325 MG tablet Commonly known as: NORCO/VICODIN   naproxen 500 MG tablet Commonly known as: NAPROSYN     TAKE these medications   amoxicillin-clavulanate 875-125 MG tablet Commonly known as: AUGMENTIN Take 1 tablet by mouth every 12 (twelve) hours for 4 days.   folic acid 1 MG tablet Commonly known as: FOLVITE Take 1 tablet (1 mg total) by mouth daily.   multivitamin with minerals Tabs tablet Take 1 tablet by mouth daily.   Oxycodone HCl 10 MG Tabs Take 1 tablet (10 mg total) by mouth every 6 (six) hours as needed for severe pain.   pantoprazole 40 MG tablet Commonly known as: PROTONIX Take 1 tablet (40 mg total) by mouth daily.   thiamine 100 MG tablet Take 1 tablet (100 mg total) by mouth daily.       If you experience worsening of your admission symptoms, develop shortness of breath, life threatening emergency, suicidal or homicidal thoughts you must seek medical attention immediately  by calling 911 or calling your MD immediately  if symptoms less severe.  You Must read complete instructions/literature along with all the possible adverse reactions/side effects for all the Medicines you take and that have been prescribed to you. Take any new Medicines after you  have completely understood and accept all the possible adverse reactions/side effects.   Please note  You were cared for by a hospitalist during your hospital stay. If you have any questions about your discharge medications or the care you received while you were in the hospital after you are discharged, you can call the unit and asked to speak with the hospitalist on call if the hospitalist that took care of you is not available. Once you are discharged, your primary care physician will handle any further medical issues. Please note that NO REFILLS for any discharge medications will be authorized once you are discharged, as it is imperative that you return to your primary care physician (or establish a relationship with a primary care physician if you do not have one) for your aftercare needs so that they can reassess your need for medications and monitor your lab values. Today   SUBJECTIVE    Some abdominal pain however much improved. No signs of withdrawal. VITAL SIGNS:  Blood pressure 109/61, pulse 85, temperature 98.7 F (37.1 C), temperature source Oral, resp. rate 18, height 5\' 11"  (1.803 m), weight 94.6 kg, SpO2 94 %.  I/O:    Intake/Output Summary (Last 24 hours) at 09/09/2019 0817 Last data filed at 09/09/2019 09/11/2019 Gross Morton 24 hour  Intake 240 ml  Output 1125 ml  Net -885 ml    PHYSICAL EXAMINATION:  GENERAL:  23 y.o.-year-old patient lying in the bed with no acute distress.  EYES: Pupils equal, round, reactive to light and accommodation. No scleral icterus. Extraocular muscles intact.  HEENT: Head atraumatic, normocephalic. Oropharynx and nasopharynx clear.  NECK:  Supple, no jugular venous distention. No thyroid enlargement, no tenderness.  LUNGS: Normal breath sounds bilaterally, no wheezing, rales,rhonchi or crepitation. No use of accessory muscles of respiration.  CARDIOVASCULAR: S1, S2 normal. No murmurs, rubs, or gallops.  ABDOMEN: Soft, mild epigastric tenderness,  non-distended. Bowel sounds present. No organomegaly or mass.  EXTREMITIES: No pedal edema, cyanosis, or clubbing.  NEUROLOGIC: Cranial nerves II through XII are intact. Muscle strength 5/5 in all extremities. Sensation intact. Gait not checked.  PSYCHIATRIC: The patient is alert and oriented x 3.  SKIN: No obvious rash, lesion, or ulcer.   DATA REVIEW:   CBC  Recent Labs  Lab 09/08/19 0440  WBC 8.6  HGB 14.4  HCT 42.4  PLT 195    Chemistries  Recent Labs  Lab 09/07/19 0421 09/08/19 0440 09/08/19 1444  NA 137 139  --   K 3.4* 2.9* 3.6  CL 98 93*  --   CO2 28 31  --   GLUCOSE 95 90  --   BUN 9 7  --   CREATININE 0.95 0.94  --   CALCIUM 8.0* 8.2*  --   MG  --  2.3  --   AST 39  --   --   ALT 49*  --   --   ALKPHOS 55  --   --   BILITOT 2.1*  --   --     Microbiology Results   Recent Results (from the past 240 hour(s))  SARS CORONAVIRUS 2 (TAT 6-24 HRS) Nasopharyngeal Nasopharyngeal Swab     Status: None   Collection Time:  09/04/19  9:59 AM   Specimen: Nasopharyngeal Swab  Result Value Ref Range Status   SARS Coronavirus 2 NEGATIVE NEGATIVE Final    Comment: (NOTE) SARS-CoV-2 target nucleic acids are NOT DETECTED. The SARS-CoV-2 RNA is generally detectable in upper and lower respiratory specimens during the acute phase of infection. Negative results do not preclude SARS-CoV-2 infection, do not rule out co-infections with other pathogens, and should not be used as the sole basis for treatment or other patient management decisions. Negative results must be combined with clinical observations, patient history, and epidemiological information. The expected result is Negative. Fact Sheet for Patients: SugarRoll.be Fact Sheet for Healthcare Providers: https://www.woods-mathews.com/ This test is not yet approved or cleared by the [place] FDA and  has been authorized for detection and/or diagnosis of SARS-CoV-2 by FDA  under an Emergency Use Authorization (EUA). This EUA will remain  in effect (meaning this test can be used) for the duration of the COVID-19 declaration under Section 56 4(b)(1) of the Act, 21 U.S.C. section 360bbb-3(b)(1), unless the authorization is terminated or revoked sooner. Performed at [facility], [place] [address], [place], [place] [address]     RADIOLOGY:  No results found.   CODE STATUS:     Code Status Orders  (From admission, onward)         Start     Ordered   09/04/19 1107  Full code  Continuous     09/04/19 1117        Code Status History    This patient has a current code status but no historical code status.   Advance Care Planning Activity     Family Discussion: girlfriend been updated by patient Consults: none   TOTAL TIME TAKING CARE OF THIS PATIENT: 40 minutes.    Fritzi Mandes M.D on 09/09/2019 at 8:17 AM  Between 7am to 6pm - Pager - [phone] After 6pm go to [email or web address] - password [email or web address]  [facility]    CC: Primary care physician; Joshua Morton

## 2019-09-09 NOTE — Progress Notes (Signed)
SL x 2 d/c'd tip intact. Tele monitor removed and returned. D/c instructions explained and understood. Work note and FMLA paperwork given to pt. Personal belongings in pt possession. Left unit via w/c accompanied by staff and girlfriend.

## 2019-09-09 NOTE — Discharge Instructions (Signed)
Patient advised alcohol abstinence patient's girlfriend is going to get primary care physician established with [facility]

## 2019-12-08 DIAGNOSIS — R109 Unspecified abdominal pain: Secondary | ICD-10-CM

## 2019-12-09 DIAGNOSIS — F410 Panic disorder [episodic paroxysmal anxiety] without agoraphobia: Secondary | ICD-10-CM | POA: Insufficient documentation

## 2020-07-12 ENCOUNTER — Other Ambulatory Visit: Payer: Self-pay

## 2020-07-12 ENCOUNTER — Encounter: Payer: Self-pay | Admitting: Family Medicine

## 2020-07-12 ENCOUNTER — Ambulatory Visit: Payer: Self-pay | Admitting: Family Medicine

## 2020-07-12 DIAGNOSIS — Z202 Contact with and (suspected) exposure to infections with a predominantly sexual mode of transmission: Secondary | ICD-10-CM

## 2020-07-12 DIAGNOSIS — Z113 Encounter for screening for infections with a predominantly sexual mode of transmission: Secondary | ICD-10-CM

## 2020-07-12 MED ORDER — METRONIDAZOLE 500 MG PO TABS: 2000.0000 mg | ORAL_TABLET | Freq: Once | ORAL | 0 refills | Status: AC

## 2020-07-12 MED ORDER — METRONIDAZOLE 500 MG PO TABS: 2000.0000 mg | ORAL_TABLET | Freq: Once | ORAL | Status: DC

## 2020-07-12 NOTE — Progress Notes (Signed)
Pt received treatment for contact to Trich per provider order. Counseled pt per provider orders and pt states understanding. Provider orders completed.

## 2020-07-12 NOTE — Progress Notes (Signed)
   [facility] Department STI clinic/screening visit  Subjective:  Joshua Morton is a 24 y.o. male being seen today for an STI screening visit. The patient reports they do not have symptoms.    Patient has the following medical conditions:   Patient Active Problem List   Diagnosis Date Noted  . Acute respiratory failure with hypoxia (HCC)   . Lobar pneumonia (HCC)   . Essential hypertension   . Tachycardia   . Abdominal pain   . Pancreatitis, alcoholic, acute 09/04/2019  . Acute pancreatitis 09/04/2019  . Fatty liver 09/04/2019  . Alcohol abuse 09/04/2019  . GERD (gastroesophageal reflux disease) 09/04/2019  . BRBPR (bright red blood per rectum) 09/04/2019     No chief complaint on file.   HPI  Patient reports that his partner was treated for trich last week.  He denies symptoms.   See flowsheet for further details and programmatic requirements.    The following portions of the patient's history were reviewed and updated as appropriate: allergies, current medications, past medical history, past social history, past surgical history and problem list.  Objective:  There were no vitals filed for this visit.  Physical Exam  Client declines exam and testing today    Assessment and Plan:  Joshua Morton is a 24 y.o. male presenting to the [facility] Department for STI screening  1. Screening examination for venereal disease  2. Venereal disease contact Contact to Trich - metroNIDAZOLE (FLAGYL) tablet 2,000 mg Co to abstain from sex x 1 week Co to always use condoms     No follow-ups on file.  No future appointments.  Larene Pickett, FNP

## 2020-08-14 ENCOUNTER — Emergency Department: Payer: Self-pay

## 2020-08-14 ENCOUNTER — Other Ambulatory Visit: Payer: Self-pay

## 2020-08-14 ENCOUNTER — Emergency Department
Admission: EM | Admit: 2020-08-14 | Discharge: 2020-08-14 | Disposition: A | Discharge status: Home / Self Care | Payer: Self-pay | Attending: Emergency Medicine | Admitting: Emergency Medicine

## 2020-08-14 DIAGNOSIS — K859 Acute pancreatitis without necrosis or infection, unspecified: Secondary | ICD-10-CM | POA: Insufficient documentation

## 2020-08-14 DIAGNOSIS — Z87891 Personal history of nicotine dependence: Secondary | ICD-10-CM | POA: Insufficient documentation

## 2020-08-14 DIAGNOSIS — I10 Essential (primary) hypertension: Secondary | ICD-10-CM | POA: Insufficient documentation

## 2020-08-14 LAB — BASIC METABOLIC PANEL
Anion gap: 11 (ref 5–15)
BUN: 12 mg/dL (ref 6–20)
CO2: 22 mmol/L (ref 22–32)
Calcium: 9.3 mg/dL (ref 8.9–10.3)
Chloride: 100 mmol/L (ref 98–111)
Creatinine, Ser: 0.98 mg/dL (ref 0.61–1.24)
GFR, Estimated: >60 mL/min (ref >60)
Glucose, Bld: 117 mg/dL — ABNORMAL HIGH (ref 70–99)
Potassium: 3.6 mmol/L (ref 3.5–5.1)
Sodium: 133 mmol/L — ABNORMAL LOW (ref 135–145)

## 2020-08-14 LAB — HEPATIC FUNCTION PANEL
ALT: 82 U/L — ABNORMAL HIGH (ref 0–44)
AST: 43 U/L — ABNORMAL HIGH (ref 15–41)
Albumin: 4.3 g/dL (ref 3.5–5.0)
Alkaline Phosphatase: 86 U/L (ref 38–126)
Bilirubin, Direct: 0.3 mg/dL — ABNORMAL HIGH (ref 0.0–0.2)
Indirect Bilirubin: 1 mg/dL — ABNORMAL HIGH (ref 0.3–0.9)
Total Bilirubin: 1.3 mg/dL — ABNORMAL HIGH (ref 0.3–1.2)
Total Protein: 7.7 g/dL (ref 6.5–8.1)

## 2020-08-14 LAB — CBC
HCT: 47 % (ref 39.0–52.0)
Hemoglobin: 17.1 g/dL — ABNORMAL HIGH (ref 13.0–17.0)
MCH: 34.1 pg — ABNORMAL HIGH (ref 26.0–34.0)
MCHC: 36.4 g/dL — ABNORMAL HIGH (ref 30.0–36.0)
MCV: 93.6 fL (ref 80.0–100.0)
Platelets: 269 10*3/uL (ref 150–400)
RBC: 5.02 MIL/uL (ref 4.22–5.81)
RDW: 11.9 % (ref 11.5–15.5)
WBC: 14.6 10*3/uL — ABNORMAL HIGH (ref 4.0–10.5)
nRBC: 0 % (ref 0.0–0.2)

## 2020-08-14 LAB — TROPONIN I (HIGH SENSITIVITY): Troponin I (High Sensitivity): 4 ng/L (ref <18)

## 2020-08-14 LAB — LACTATE DEHYDROGENASE: LDH: 142 U/L (ref 98–192)

## 2020-08-14 LAB — LIPASE, BLOOD: Lipase: 190 U/L — ABNORMAL HIGH (ref 11–51)

## 2020-08-14 MED ORDER — ONDANSETRON 8 MG PO TBDP: 8.0000 mg | ORAL_TABLET | Freq: Three times a day (TID) | ORAL | 0 refills | Status: DC | PRN

## 2020-08-14 MED ORDER — SODIUM CHLORIDE 0.9 % IV BOLUS
1000.0000 mL | Freq: Once | INTRAVENOUS | Status: AC
  Administered 2020-08-14: 1000 mL via INTRAVENOUS

## 2020-08-14 MED ORDER — MORPHINE SULFATE (PF) 4 MG/ML IV SOLN
4.0000 mg | Freq: Once | INTRAVENOUS | Status: AC
  Administered 2020-08-14: 4 mg via INTRAVENOUS
  Filled 2020-08-14: qty 1

## 2020-08-14 MED ORDER — TRAMADOL HCL 50 MG PO TABS: 50.0000 mg | ORAL_TABLET | Freq: Four times a day (QID) | ORAL | 0 refills | Status: AC | PRN

## 2020-08-14 MED ORDER — ONDANSETRON HCL 4 MG/2ML IJ SOLN
4.0000 mg | Freq: Once | INTRAMUSCULAR | Status: AC
  Administered 2020-08-14: 4 mg via INTRAVENOUS
  Filled 2020-08-14: qty 2

## 2020-08-14 NOTE — ED Notes (Signed)
Pt c/o LUQ abdominal pain/L lower CP, pt states pain started approx 4 days ago. Pt states "I think I have pancreatitis again". Pt states hx of alcohol abuse, reports is usually drinking about 1 pint/day, reports last drink 4 days ago when pain started. Pt states intermittent N/V since pain started. Pt states feels like "someone has their hand in there and is just squeezing". Pt otherwise A&O x4, NAD noted at this time.

## 2020-08-14 NOTE — ED Notes (Signed)
Pt taken to X-ray at this time. Will assess and introduce self to patient when patient comes back.

## 2020-08-14 NOTE — ED Provider Notes (Signed)
[facility] Emergency Department Provider Note ____________________________________________   First MD Initiated Contact with Patient 08/14/20 [phone]     (approximate)  I have reviewed the triage vital signs and the nursing notes.   HISTORY  Chief Complaint Chest Pain    HPI Joshua Morton is a 24 y.o. male with PMH as noted below including history of alcohol abuse and pancreatitis who presents with upper abdominal pain over the last 3 days, persistent course, somewhat radiating towards the left side, and associated with nausea and decreased appetite.  The patient states that it feels similar to when he has had pancreatitis in the past.  He states that his last drink was 4 days ago.  He denies any withdrawal symptoms.  Past Medical History:  Diagnosis Date  . Acute pancreatitis 09/04/2019  . Acute respiratory failure with hypoxia (HCC)   . Attempted suicide (HCC)   . GERD (gastroesophageal reflux disease) 09/04/2019  . Pancreatitis, alcoholic, acute 09/04/2019    Patient Active Problem List   Diagnosis Date Noted  . Essential hypertension   . Tachycardia   . Abdominal pain   . Fatty liver 09/04/2019  . Alcohol abuse 09/04/2019    No past surgical history on file.  Prior to Admission medications   Medication Sig Start Date End Date Taking? Authorizing Provider  ondansetron (ZOFRAN ODT) 8 MG disintegrating tablet Take 1 tablet (8 mg total) by mouth every 8 (eight) hours as needed for nausea or vomiting. 08/14/20   Dionne Bucy, MD  traMADol (ULTRAM) 50 MG tablet Take 1 tablet (50 mg total) by mouth every 6 (six) hours as needed for up to 5 days for severe pain. 08/14/20 08/19/20  Dionne Bucy, MD    Allergies Patient has no known allergies.  No family history on file.  Social History Social History   Tobacco Use  . Smoking status: Former Smoker    Packs/day: 0.10    Types: Cigarettes  . Smokeless tobacco: Current User    . Tobacco comment: vapes enough for about 1-2 cigarettes/day  Substance Use Topics  . Alcohol use: Yes    Comment: 6 pack/week  . Drug use: No    Review of Systems  Constitutional: No fever/chills. Eyes: No visual changes. ENT: No sore throat. Cardiovascular: Denies chest pain. Respiratory: Denies shortness of breath. Gastrointestinal: Positive for nausea and vomiting. Genitourinary: Negative for dysuria.  Musculoskeletal: Positive for back pain. Skin: Negative for rash. Neurological: Negative for headache.   ____________________________________________   PHYSICAL EXAM:  VITAL SIGNS: ED Triage Vitals  Enc Vitals Group     BP 08/14/20 0848 (!) 154/93     Pulse Rate 08/14/20 0848 66     Resp 08/14/20 0848 16     Temp 08/14/20 0848 97.9 F (36.6 C)     Temp Source 08/14/20 0848 Oral     SpO2 08/14/20 0848 100 %     Weight 08/14/20 0903 190 lb (86.2 kg)     Height 08/14/20 0903 5\' 11"  (1.803 m)     Head Circumference --      Peak Flow --      Pain Score 08/14/20 0903 7     Pain Loc --      Pain Edu? --      Excl. in GC? --     Constitutional: Alert and oriented.  Uncomfortable appearing but in no acute distress. Eyes: Conjunctivae are normal.  No scleral icterus. Head: Atraumatic. Nose: No congestion/rhinnorhea. Mouth/Throat: Mucous membranes  are moist.   Neck: Normal range of motion.  Cardiovascular: Normal rate, regular rhythm. Grossly normal heart sounds.  Good peripheral circulation. Respiratory: Normal respiratory effort.  No retractions. Lungs CTAB. Gastrointestinal: Soft with moderate epigastric and left upper quadrant tenderness.  No distention.  Genitourinary: No flank tenderness. Musculoskeletal: Extremities warm and well perfused.  Neurologic:  Normal speech and language. No gross focal neurologic deficits are appreciated.  Skin:  Skin is warm and dry. No rash noted. Psychiatric: Mood and affect are normal. Speech and behavior are  normal.  ____________________________________________   LABS (all labs ordered are listed, but only abnormal results are displayed)  Labs Reviewed  BASIC METABOLIC PANEL - Abnormal; Notable for the following components:      Result Value   Sodium 133 (*)    Glucose, Bld 117 (*)    All other components within normal limits  CBC - Abnormal; Notable for the following components:   WBC 14.6 (*)    Hemoglobin 17.1 (*)    MCH 34.1 (*)    MCHC 36.4 (*)    All other components within normal limits  LIPASE, BLOOD - Abnormal; Notable for the following components:   Lipase 190 (*)    All other components within normal limits  HEPATIC FUNCTION PANEL - Abnormal; Notable for the following components:   AST 43 (*)    ALT 82 (*)    Total Bilirubin 1.3 (*)    Bilirubin, Direct 0.3 (*)    Indirect Bilirubin 1.0 (*)    All other components within normal limits  LACTATE DEHYDROGENASE  TROPONIN I (HIGH SENSITIVITY)   ____________________________________________  EKG  ED ECG REPORT I, Dionne Bucy, the attending physician, personally viewed and interpreted this ECG.  Date: 08/14/2020 EKG Time: 0848 Rate: 59 Rhythm: normal sinus rhythm QRS Axis: normal Intervals: normal ST/T Wave abnormalities: normal Narrative Interpretation: no evidence of acute ischemia  ____________________________________________  RADIOLOGY    ____________________________________________   PROCEDURES  Procedure(s) performed: No  Procedures  Critical Care performed: No ____________________________________________   INITIAL IMPRESSION / ASSESSMENT AND PLAN / ED COURSE  Pertinent labs & imaging results that were available during my care of the patient were reviewed by me and considered in my medical decision making (see chart for details).  24 year old male with PMH as noted above including a history of alcohol abuse and pancreatitis presents with epigastric and left upper quadrant abdominal  pain over the last several days.  He states that he last drink 4 days ago.  I reviewed the past medical records in epic.  The patient was most recently admitted for alcohol induced pancreatitis in November of last year.  He also had pneumonia at that time.  On exam currently, he is slightly uncomfortable but overall well-appearing.  His vital signs are normal except for hypertension.  The abdomen is soft with mild to moderate epigastric tenderness.  There is no jaundice.  Differential includes pancreatitis, gastritis, less likely other hepatobiliary etiology, colitis, diverticulitis, gastroenteritis.  We will obtain a lab work-up, give fluids and analgesia, and reassess.  ----------------------------------------- 2:07 PM on 08/14/2020 -----------------------------------------  Lab work-up reveals a lipase of 190 and minimally elevated LFTs.  The patient's RBC count is slightly elevated, but the rest of the labs are reassuring.  His Ranson score is 0.  On reassessment, the patient had significant improvement in his pain and was feeling a lot better.  I give a second liter of fluid and some additional analgesia.  He is now feeling  much better.  He is tolerating p.o.   Given the relatively mild pancreatitis, I offered the patient possible discharge versus admission for further IV fluids and monitoring.  He strongly prefers to go home.  At this time, given the Ranson of 0, the relatively low lipase, and the otherwise reassuring labs and normal vital signs, the patient appears to have mild enough pancreatitis that he is appropriate for outpatient treatment.  I will prescribe analgesia and an antiemetic.  I have advised him on an appropriate diet and on avoiding alcohol.  I have also provided referral information for outpatient alcohol/substance abuse counseling.  I discussed the results of the work-up and the plan of care with the patient.  I gave him thorough return precautions and he expressed  understanding.  ____________________________________________   FINAL CLINICAL IMPRESSION(S) / ED DIAGNOSES  Final diagnoses:  Acute pancreatitis without infection or necrosis, unspecified pancreatitis type      NEW MEDICATIONS STARTED DURING THIS VISIT:  New Prescriptions   ONDANSETRON (ZOFRAN ODT) 8 MG DISINTEGRATING TABLET    Take 1 tablet (8 mg total) by mouth every 8 (eight) hours as needed for nausea or vomiting.   TRAMADOL (ULTRAM) 50 MG TABLET    Take 1 tablet (50 mg total) by mouth every 6 (six) hours as needed for up to 5 days for severe pain.     Note:  This document was prepared using Dragon voice recognition software and may include unintentional dictation errors.    Dionne Bucy, MD 08/14/20 1409

## 2020-08-14 NOTE — Discharge Instructions (Signed)
You should avoid any fatty foods and keep your diet extremely light over the next several days.  You should also avoid any alcohol.  Make sure to drink plenty of fluids.  You can take the Zofran (ondansetron) as needed for nausea, and the tramadol if needed for pain.  Return to the ER for new, worsening, or persistent severe abdominal pain, vomiting, fever, weakness, or any other new or worsening symptoms that concern you.  We have also provided a list of resources for outpatient substance abuse counseling if you are interested in speaking to somebody about your alcohol use.

## 2020-08-14 NOTE — ED Triage Notes (Signed)
Pt via POV from home pt c/o L sided chest pain. Pt states that he vomited twice last night and having some SOB. Denies radiation. Pt is A&Ox4 and NAD. Pt took 500mg  Tylenol and BC powder this morning with no relief.

## 2021-03-14 ENCOUNTER — Other Ambulatory Visit: Payer: Self-pay

## 2021-03-14 ENCOUNTER — Encounter: Payer: Self-pay | Admitting: Emergency Medicine

## 2021-03-14 ENCOUNTER — Emergency Department: Payer: Self-pay

## 2021-03-14 DIAGNOSIS — Z20822 Contact with and (suspected) exposure to covid-19: Secondary | ICD-10-CM | POA: Diagnosis present

## 2021-03-14 DIAGNOSIS — F1010 Alcohol abuse, uncomplicated: Secondary | ICD-10-CM | POA: Diagnosis present

## 2021-03-14 DIAGNOSIS — I10 Essential (primary) hypertension: Secondary | ICD-10-CM | POA: Diagnosis present

## 2021-03-14 DIAGNOSIS — K219 Gastro-esophageal reflux disease without esophagitis: Secondary | ICD-10-CM | POA: Diagnosis present

## 2021-03-14 DIAGNOSIS — E871 Hypo-osmolality and hyponatremia: Secondary | ICD-10-CM | POA: Diagnosis present

## 2021-03-14 DIAGNOSIS — F17290 Nicotine dependence, other tobacco product, uncomplicated: Secondary | ICD-10-CM | POA: Diagnosis present

## 2021-03-14 DIAGNOSIS — K852 Alcohol induced acute pancreatitis without necrosis or infection: Principal | ICD-10-CM | POA: Diagnosis present

## 2021-03-14 LAB — CBC
HCT: 45.6 % (ref 39.0–52.0)
Hemoglobin: 15.9 g/dL (ref 13.0–17.0)
MCH: 32.6 pg (ref 26.0–34.0)
MCHC: 34.9 g/dL (ref 30.0–36.0)
MCV: 93.4 fL (ref 80.0–100.0)
Platelets: 256 10*3/uL (ref 150–400)
RBC: 4.88 MIL/uL (ref 4.22–5.81)
RDW: 15.4 % (ref 11.5–15.5)
WBC: 12.3 10*3/uL — ABNORMAL HIGH (ref 4.0–10.5)
nRBC: 0 % (ref 0.0–0.2)

## 2021-03-14 LAB — BASIC METABOLIC PANEL
Anion gap: 13 (ref 5–15)
BUN: 17 mg/dL (ref 6–20)
CO2: 23 mmol/L (ref 22–32)
Calcium: 9.1 mg/dL (ref 8.9–10.3)
Chloride: 100 mmol/L (ref 98–111)
Creatinine, Ser: 0.95 mg/dL (ref 0.61–1.24)
GFR, Estimated: >60 mL/min (ref >60)
Glucose, Bld: 93 mg/dL (ref 70–99)
Potassium: 3.7 mmol/L (ref 3.5–5.1)
Sodium: 136 mmol/L (ref 135–145)

## 2021-03-14 LAB — TROPONIN I (HIGH SENSITIVITY): Troponin I (High Sensitivity): 3 ng/L (ref <18)

## 2021-03-14 NOTE — ED Triage Notes (Signed)
Pt to ED from home c/o n/v x3 today and mid chest pain that is sharp, denies SOB or recent illness.  Pt A&Ox4, chest rise even and unlabored, in NAD at this time.

## 2021-03-15 ENCOUNTER — Encounter: Payer: Self-pay | Admitting: Internal Medicine

## 2021-03-15 ENCOUNTER — Inpatient Hospital Stay
Admission: EM | Admit: 2021-03-15 | Discharge: 2021-03-17 | DRG: 439 | Disposition: A | Discharge status: Home / Self Care | Payer: Self-pay | Attending: Internal Medicine | Admitting: Internal Medicine

## 2021-03-15 ENCOUNTER — Other Ambulatory Visit: Payer: Self-pay

## 2021-03-15 ENCOUNTER — Emergency Department: Payer: Self-pay

## 2021-03-15 DIAGNOSIS — K760 Fatty (change of) liver, not elsewhere classified: Secondary | ICD-10-CM

## 2021-03-15 DIAGNOSIS — K860 Alcohol-induced chronic pancreatitis: Secondary | ICD-10-CM | POA: Diagnosis present

## 2021-03-15 DIAGNOSIS — K852 Alcohol induced acute pancreatitis without necrosis or infection: Principal | ICD-10-CM

## 2021-03-15 DIAGNOSIS — K219 Gastro-esophageal reflux disease without esophagitis: Secondary | ICD-10-CM

## 2021-03-15 DIAGNOSIS — F1010 Alcohol abuse, uncomplicated: Secondary | ICD-10-CM

## 2021-03-15 LAB — TROPONIN I (HIGH SENSITIVITY): Troponin I (High Sensitivity): 3 ng/L (ref <18)

## 2021-03-15 LAB — CBC
HCT: 45.2 % (ref 39.0–52.0)
Hemoglobin: 15.8 g/dL (ref 13.0–17.0)
MCH: 33.1 pg (ref 26.0–34.0)
MCHC: 35 g/dL (ref 30.0–36.0)
MCV: 94.8 fL (ref 80.0–100.0)
Platelets: 238 10*3/uL (ref 150–400)
RBC: 4.77 MIL/uL (ref 4.22–5.81)
RDW: 15.6 % — ABNORMAL HIGH (ref 11.5–15.5)
WBC: 14.3 10*3/uL — ABNORMAL HIGH (ref 4.0–10.5)
nRBC: 0 % (ref 0.0–0.2)

## 2021-03-15 LAB — HEPATIC FUNCTION PANEL
ALT: 54 U/L — ABNORMAL HIGH (ref 0–44)
AST: 39 U/L (ref 15–41)
Albumin: 4.3 g/dL (ref 3.5–5.0)
Alkaline Phosphatase: 94 U/L (ref 38–126)
Bilirubin, Direct: 0.4 mg/dL — ABNORMAL HIGH (ref 0.0–0.2)
Indirect Bilirubin: 1.4 mg/dL — ABNORMAL HIGH (ref 0.3–0.9)
Total Bilirubin: 1.8 mg/dL — ABNORMAL HIGH (ref 0.3–1.2)
Total Protein: 8 g/dL (ref 6.5–8.1)

## 2021-03-15 LAB — HIV ANTIBODY (ROUTINE TESTING W REFLEX): HIV Screen 4th Generation wRfx: NONREACTIVE

## 2021-03-15 LAB — RESP PANEL BY RT-PCR (FLU A&B, COVID) ARPGX2
Influenza A by PCR: NEGATIVE
Influenza B by PCR: NEGATIVE
SARS Coronavirus 2 by RT PCR: NEGATIVE

## 2021-03-15 LAB — LIPASE, BLOOD: Lipase: 182 U/L — ABNORMAL HIGH (ref 11–51)

## 2021-03-15 LAB — PHOSPHORUS: Phosphorus: 3.5 mg/dL (ref 2.5–4.6)

## 2021-03-15 LAB — MAGNESIUM: Magnesium: 1.9 mg/dL (ref 1.7–2.4)

## 2021-03-15 MED ORDER — SODIUM CHLORIDE 0.9 % IV BOLUS
1000.0000 mL | Freq: Once | INTRAVENOUS | Status: AC
  Administered 2021-03-15: 1000 mL via INTRAVENOUS

## 2021-03-15 MED ORDER — MORPHINE SULFATE (PF) 2 MG/ML IV SOLN
2.0000 mg | Freq: Once | INTRAVENOUS | Status: AC
  Administered 2021-03-15: 13:00:00 2 mg via INTRAVENOUS
  Filled 2021-03-15: qty 1

## 2021-03-15 MED ORDER — LORAZEPAM 2 MG/ML IJ SOLN
1.0000 mg | INTRAMUSCULAR | Status: DC | PRN
Start: 2021-03-15 — End: 2021-03-17
  Administered 2021-03-15: 20:00:00 1 mg via INTRAVENOUS
  Administered 2021-03-17: 08:00:00 4 mg via INTRAVENOUS
  Filled 2021-03-15: qty 1
  Filled 2021-03-15: qty 2

## 2021-03-15 MED ORDER — ONDANSETRON HCL 4 MG/2ML IJ SOLN: 4.0000 mg | Freq: Four times a day (QID) | INTRAMUSCULAR | Status: DC | PRN

## 2021-03-15 MED ORDER — MELATONIN 5 MG PO TABS
5.0000 mg | ORAL_TABLET | Freq: Every day | ORAL | Status: DC
  Administered 2021-03-15 – 2021-03-16 (×2): 5 mg via ORAL
  Filled 2021-03-15 (×3): qty 1

## 2021-03-15 MED ORDER — PANTOPRAZOLE SODIUM 40 MG IV SOLR
40.0000 mg | INTRAVENOUS | Status: DC
  Administered 2021-03-15 – 2021-03-17 (×3): 40 mg via INTRAVENOUS
  Filled 2021-03-15 (×3): qty 40

## 2021-03-15 MED ORDER — HYDROMORPHONE HCL 1 MG/ML IJ SOLN
0.5000 mg | Freq: Once | INTRAMUSCULAR | Status: AC
  Administered 2021-03-15: 0.5 mg via INTRAVENOUS
  Filled 2021-03-15: qty 1

## 2021-03-15 MED ORDER — ONDANSETRON HCL 4 MG/2ML IJ SOLN
4.0000 mg | Freq: Once | INTRAMUSCULAR | Status: AC
  Administered 2021-03-15: 4 mg via INTRAVENOUS
  Filled 2021-03-15: qty 2

## 2021-03-15 MED ORDER — MELATONIN 3 MG PO TABS
3.0000 mg | ORAL_TABLET | Freq: Every day | ORAL | Status: DC
  Filled 2021-03-15: qty 1

## 2021-03-15 MED ORDER — ONDANSETRON HCL 4 MG PO TABS: 4.0000 mg | ORAL_TABLET | Freq: Four times a day (QID) | ORAL | Status: DC | PRN

## 2021-03-15 MED ORDER — THIAMINE HCL 100 MG/ML IJ SOLN
Freq: Once | INTRAVENOUS | Status: AC
  Filled 2021-03-15 (×2): qty 1000

## 2021-03-15 MED ORDER — HYDRALAZINE HCL 20 MG/ML IJ SOLN
10.0000 mg | Freq: Four times a day (QID) | INTRAMUSCULAR | Status: DC | PRN
  Administered 2021-03-15 – 2021-03-16 (×2): 10 mg via INTRAVENOUS
  Filled 2021-03-15 (×2): qty 1

## 2021-03-15 MED ORDER — ENOXAPARIN SODIUM 40 MG/0.4ML IJ SOSY
40.0000 mg | PREFILLED_SYRINGE | INTRAMUSCULAR | Status: DC
  Administered 2021-03-15 – 2021-03-17 (×3): 40 mg via SUBCUTANEOUS
  Filled 2021-03-15 (×3): qty 0.4

## 2021-03-15 MED ORDER — MORPHINE SULFATE (PF) 2 MG/ML IV SOLN
2.0000 mg | INTRAVENOUS | Status: DC | PRN
Start: 2021-03-15 — End: 2021-03-16
  Administered 2021-03-15 – 2021-03-16 (×6): 2 mg via INTRAVENOUS
  Filled 2021-03-15 (×6): qty 1

## 2021-03-15 MED ORDER — LORAZEPAM 1 MG PO TABS
1.0000 mg | ORAL_TABLET | ORAL | Status: DC | PRN
  Administered 2021-03-15: 1 mg via ORAL
  Filled 2021-03-15: qty 1

## 2021-03-15 NOTE — ED Notes (Signed)
Dr Agbata at bedside. 

## 2021-03-15 NOTE — ED Notes (Signed)
Reina RN aware of assigned bed 

## 2021-03-15 NOTE — ED Provider Notes (Addendum)
[facility] Emergency Department Provider Note  ____________________________________________   Event Date/Time   First MD Initiated Contact with Patient 03/15/21 0143     (approximate)  I have reviewed the triage vital signs and the nursing notes.   HISTORY  Chief Complaint Chest Pain, Nausea, and Vomiting    HPI Joshua Morton is a 25 y.o. 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  Patient states that he had been very heavily drinking and 2 days ago he stopped.  Denies any history of withdrawal.  States that he has pain in his upper abdomen that is severe, constant, rating up into his chest, nothing makes it better, nothing makes it worse.  States the pain radiates up into his chest.  Denies feeling really short of breath.  No leg swelling, no history of PEs.  Reports that this feels similar to his prior episodes of pancreatitis.  Does report some associated vomiting          Past Medical History:  Diagnosis Date  . Acute pancreatitis 09/04/2019  . Acute respiratory failure with hypoxia (HCC)   . Attempted suicide (HCC)   . GERD (gastroesophageal reflux disease) 09/04/2019  . Pancreatitis, alcoholic, acute 09/04/2019    Patient Active Problem List   Diagnosis Date Noted  . Essential hypertension   . Tachycardia   . Abdominal pain   . Fatty liver 09/04/2019  . Alcohol abuse 09/04/2019    History reviewed. No pertinent surgical history.  Prior to Admission medications   Medication Sig Start Date End Date Taking? Authorizing Provider  ondansetron (ZOFRAN ODT) 8 MG disintegrating tablet Take 1 tablet (8 mg total) by mouth every 8 (eight) hours as needed for nausea or vomiting. 08/14/20   Dionne Bucy, MD    Allergies Patient has no known allergies.  History reviewed. No pertinent family history.  Social History Social History   Tobacco Use  . Smoking status:  Former Smoker    Packs/day: 0.10    Types: Cigarettes  . Smokeless tobacco: Current User  . Tobacco comment: vapes enough for about 1-2 cigarettes/day  Vaping Use  . Vaping Use: Every day  Substance Use Topics  . Alcohol use: Not Currently  . Drug use: No      Review of Systems Constitutional: No fever/chills Eyes: No visual changes. ENT: No sore throat. Cardiovascular: Positive chest pain Respiratory: Denies shortness of breath. Gastrointestinal: Positive abdominal pain, vomiting Genitourinary: Negative for dysuria. Musculoskeletal: Negative for back pain. Skin: Negative for rash. Neurological: Negative for headaches, focal weakness or numbness. All other ROS negative ____________________________________________   PHYSICAL EXAM:  VITAL SIGNS: ED Triage Vitals  Enc Vitals Group     BP 03/14/21 2205 (!) 146/100     Pulse Rate 03/14/21 2205 78     Resp 03/15/21 0113 18     Temp 03/14/21 2205 98.4 F (36.9 C)     Temp Source 03/14/21 2205 Oral     SpO2 03/14/21 2205 99 %     Weight 03/14/21 2206 180 lb (81.6 kg)     Height 03/14/21 2206 5\' 11"  (1.803 m)     Head Circumference --      Peak Flow --      Pain Score 03/14/21 2206 8     Pain Loc --      Pain Edu? --      Excl. in GC? --     Constitutional: Alert and  oriented. Well appearing and in no acute distress. Eyes: Conjunctivae are normal. EOMI. Head: Atraumatic. Nose: No congestion/rhinnorhea. Mouth/Throat: Mucous membranes are moist.   Neck: No stridor. Trachea Midline. FROM Cardiovascular: Normal rate, regular rhythm. Grossly normal heart sounds.  Good peripheral circulation. Respiratory: Normal respiratory effort.  No retractions. Lungs CTAB. Gastrointestinal: Significant tenderness with some guarding in the upper abdomen.  No distention. No abdominal bruits.  Musculoskeletal: No lower extremity tenderness nor edema.  No joint effusions. Neurologic:  Normal speech and language. No gross focal neurologic  deficits are appreciated.  Skin:  Skin is warm, dry and intact. No rash noted. Psychiatric: Mood and affect are normal. Speech and behavior are normal. GU: Deferred   ____________________________________________   LABS (all labs ordered are listed, but only abnormal results are displayed)  Labs Reviewed  CBC - Abnormal; Notable for the following components:      Result Value   WBC 12.3 (*)    All other components within normal limits  BASIC METABOLIC PANEL  HEPATIC FUNCTION PANEL  LIPASE, BLOOD  TROPONIN I (HIGH SENSITIVITY)  TROPONIN I (HIGH SENSITIVITY)   ____________________________________________   ED ECG REPORT I, Concha Se, the attending physician, personally viewed and interpreted this ECG.  Normal sinus rate of 77, no ST elevation, no T wave inversions except for lead III, normal intervals with some sinus arrhythmia ____________________________________________  RADIOLOGY Vela Prose, personally viewed and evaluated these images (plain radiographs) as part of my medical decision making, as well as reviewing the written report by the radiologist.  ED MD interpretation: No pneumonia  Official radiology report(s): DG Chest 2 View  Result Date: 03/14/2021 CLINICAL DATA:  Chest pain. EXAM: CHEST - 2 VIEW COMPARISON:  Chest x-ray 08/14/2020, CT chest 09/04/2019 FINDINGS: The heart size and mediastinal contours are within normal limits. No focal consolidation. No pulmonary edema. No pleural effusion. No pneumothorax. No acute osseous abnormality. IMPRESSION: No active cardiopulmonary disease. Electronically Signed   By: Tish Frederickson M.D.   On: 03/14/2021 22:22    ____________________________________________   PROCEDURES  Procedure(s) performed (including Critical Care):  .1-3 Lead EKG Interpretation Performed by: Concha Se, MD Authorized by: Concha Se, MD     Interpretation: normal     ECG rate:  70s   ECG rate assessment: normal     Rhythm:  sinus rhythm     Ectopy: none     Conduction: normal       ____________________________________________   INITIAL IMPRESSION / ASSESSMENT AND PLAN / ED COURSE  Joshua Morton was evaluated in Emergency Department on 03/15/2021 for the symptoms described in the history of present illness. He was evaluated in the context of the global COVID-19 pandemic, which necessitated consideration that the patient might be at risk for infection with the SARS-CoV-2 virus that causes COVID-19. Institutional protocols and algorithms that pertain to the evaluation of patients at risk for COVID-19 are in a state of rapid change based on information released by regulatory bodies including the CDC and federal and state organizations. These policies and algorithms were followed during the patient's care in the ED.    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  Patient significantly tender on exam with some guarding therefore will get CT scan to evaluate for any complications such as pseudocyst, necrosis given recurrent episodes.  Seems less likely to be related to gallbladder pathology.  Cardiac markers were ordered to  evaluate for ACS.  Patient be kept in the cardiac monitor.  We will give patient 1 L fluid, 1 dose of IV Dilaudid, IV Zofran and reevaluate after work-up is complete  Patient is required a second dose of Dilaudid.  Patient reports still reporting significant pain.  Discussed different options including admission versus going home with oral pain meds.  Patient states that he does not feel like he can go home at this time.  Will discuss possible team for admission   Did discuss with patient needing be follow-up for outpatient MRI.        ____________________________________________   FINAL CLINICAL IMPRESSION(S) / ED DIAGNOSES   Final diagnoses:  Alcohol-induced acute pancreatitis, unspecified complication  status      MEDICATIONS GIVEN DURING THIS VISIT:  Medications  HYDROmorphone (DILAUDID) injection 0.5 mg (0.5 mg Intravenous Given 03/15/21 0312)  ondansetron (ZOFRAN) injection 4 mg (4 mg Intravenous Given 03/15/21 0311)  sodium chloride 0.9 % bolus 1,000 mL (1,000 mLs Intravenous New Bag/Given 03/15/21 0311)  HYDROmorphone (DILAUDID) injection 0.5 mg (0.5 mg Intravenous Given 03/15/21 0522)     ED Discharge Orders    None       Note:  This document was prepared using Dragon voice recognition software and may include unintentional dictation errors.   Concha Se, MD 03/15/21 8341    Concha Se, MD 03/15/21 [phone]

## 2021-03-15 NOTE — TOC Progression Note (Signed)
Transition of Care [facility]) - Progression Note    Patient Details  Name: Joshua Morton MRN: [id number] Date of Birth: 1996-05-27  [facility] CM/SW Contact  Caryn Section, RN Phone Number: 03/15/2021, 3:16 PM  Clinical Narrative:   TOC in to see patient, states he lives at home and has assistance from significant other.  States he has no problems getting medication or getting to appointments, has no home health services currently.  TOC contact information given, TOC to follow for needs.         Expected Discharge Plan and Services                                                 Social Determinants of Health (SDOH) Interventions    Readmission Risk Interventions No flowsheet data found.

## 2021-03-15 NOTE — H&P (Signed)
History and Physical    Klein Willcox ZMO:[id number] DOB: 1996-05-19 DOA: 03/15/2021  PCP: Patient, No Pcp Per (Inactive)   Patient coming from: Home  I have personally briefly reviewed patient's old medical records in [facility]  Chief Complaint: Abdominal pain  HPI: Joshua Morton is a 25 y.o. male with medical history significant for GERD, alcohol dependence, history of alcoholic pancreatitis who presents to the ER for evaluation of pain mostly in the epigastrium which he describes as a sharp, constant pain rated 6 x 10 in intensity at its worst with radiation into his chest.  Pain is associated with nausea and multiple episodes of emesis. Patient states that it feels similar to his prior episode of pancreatitis. He admits to daily alcohol use and his last drink was about 3 days prior to his admission.  He denies having any symptoms of alcohol withdrawal when he does not drink. He denies having any fever, no chills, no dizziness, no lightheadedness, no cough, no headache, no urinary symptoms, no palpitations, no diaphoresis, no blurred vision, no focal deficits. Labs show sodium 136, potassium 3.7, chloride 100, bicarb 23, glucose 93, BUN 17, creatinine 0.95, calcium 9.1, alkaline phosphatase 94, albumin 4.3, lipase 182, AST 39, ALT 54, total protein 8.0, troponin 3, white count 12.3, hemoglobin 15.9, hematocrit 45.6, MCV 93.4, RDW 15.4, platelet count 256 Respiratory viral panel is negative Chest x-ray reviewed by me shows no acute cardiopulmonary disease CT scan of abdomen and pelvis shows acute pancreatitis.  No pseudocyst formation. Likely associated reactive changes of the hepatic flexure.Marked hepatic steatosis Indeterminate poorly defined 3.4 x 3.8 x 4.7 cm left medial hepatic lobe lesion (new from 2015, but grossly similar compared to 2020). Recommend nonemergent MRI liver protocol for further evaluation. Twelve-lead EKG reviewed by me shows sinus rhythm    ED  Course: Patient is a 25 year old Caucasian male who presents to the ER for evaluation of abdominal pain with radiation to his chest.  He admits to daily alcohol use and has had an episode of acute alcoholic pancreatitis in the past.  He feels this pain is similar to the pain he had when he had pancreatitis. CT scan of abdomen and pelvis confirms acute pancreatitis. Patient will be admitted to the hospital for further evaluation.   Review of Systems: As per HPI otherwise all other systems reviewed and negative.    Past Medical History:  Diagnosis Date  . Acute pancreatitis 09/04/2019  . Acute respiratory failure with hypoxia (HCC)   . Attempted suicide (HCC)   . GERD (gastroesophageal reflux disease) 09/04/2019  . Pancreatitis, alcoholic, acute 09/04/2019    History reviewed. No pertinent surgical history.   reports that he has quit smoking. His smoking use included cigarettes. He smoked 0.10 packs per day. He uses smokeless tobacco. He reports previous alcohol use. He reports that he does not use drugs.  No Known Allergies  Family History  Problem Relation Age of Onset  . Hypertension Mother       Prior to Admission medications   Medication Sig Start Date End Date Taking? Authorizing Provider  acetaminophen (TYLENOL) 500 MG tablet Take 500-1,000 mg by mouth every 6 (six) hours as needed for mild pain or moderate pain.   Yes [provider]  ibuprofen (ADVIL) 200 MG tablet Take 400-600 mg by mouth every 6 (six) hours as needed for mild pain or moderate pain.   Yes [provider]    Physical Exam: Vitals:   03/15/21 0354  03/15/21 0700 03/15/21 0800 03/15/21 0830  BP: 135/88 (!) 140/92 (!) 140/100   Pulse: 83 80 67 61  Resp: 16 16 18 16   Temp: 97.7 F (36.5 C)     TempSrc: Oral     SpO2: 98% 97% 98% 97%  Weight:      Height:         Vitals:   03/15/21 0646 03/15/21 0700 03/15/21 0800 03/15/21 0830  BP: 135/88 (!) 140/92 (!) 140/100   Pulse: 83 80  67 61  Resp: 16 16 18 16   Temp: 97.7 F (36.5 C)     TempSrc: Oral     SpO2: 98% 97% 98% 97%  Weight:      Height:          Constitutional: Alert and oriented x 3 . Not in any apparent distress HEENT:      Head: Normocephalic and atraumatic.         Eyes: PERLA, EOMI, Conjunctivae are normal. Sclera is non-icteric.       Mouth/Throat: Mucous membranes are moist.       Neck: Supple with no signs of meningismus. Cardiovascular: Regular rate and rhythm. No murmurs, gallops, or rubs. 2+ symmetrical distal pulses are present . No JVD. No LE edema Respiratory: Respiratory effort normal .Lungs sounds clear bilaterally. No wheezes, crackles, or rhonchi.  Gastrointestinal: Soft, epigastric tenderness, and non distended with positive bowel sounds.  Genitourinary: No CVA tenderness. Musculoskeletal: Nontender with normal range of motion in all extremities. No cyanosis, or erythema of extremities. Neurologic:  Face is symmetric. Moving all extremities. No gross focal neurologic deficits . Skin: Skin is warm, dry.  No rash or ulcers Psychiatric: Mood and affect are normal    Labs on Admission: I have personally reviewed following labs and imaging studies  CBC: Recent Labs  Lab 03/14/21 2210  WBC 12.3*  HGB 15.9  HCT 45.6  MCV 93.4  PLT 256   Basic Metabolic Panel: Recent Labs  Lab 03/14/21 2210  NA 136  K 3.7  CL 100  CO2 23  GLUCOSE 93  BUN 17  CREATININE 0.95  CALCIUM 9.1   GFR: Estimated Creatinine Clearance: 125.5 mL/min (by C-G formula based on SCr of 0.95 mg/dL). Liver Function Tests: Recent Labs  Lab 03/15/21 0114  AST 39  ALT 54*  ALKPHOS 94  BILITOT 1.8*  PROT 8.0  ALBUMIN 4.3   Recent Labs  Lab 03/15/21 0114  LIPASE 182*   No results for input(s): AMMONIA in the last 168 hours. Coagulation Profile: No results for input(s): INR, PROTIME in the last 168 hours. Cardiac Enzymes: No results for input(s): CKTOTAL, CKMB, CKMBINDEX, TROPONINI in the  last 168 hours. BNP (last 3 results) No results for input(s): PROBNP in the last 8760 hours. HbA1C: No results for input(s): HGBA1C in the last 72 hours. CBG: No results for input(s): GLUCAP in the last 168 hours. Lipid Profile: No results for input(s): CHOL, HDL, LDLCALC, TRIG, CHOLHDL, LDLDIRECT in the last 72 hours. Thyroid Function Tests: No results for input(s): TSH, T4TOTAL, FREET4, T3FREE, THYROIDAB in the last 72 hours. Anemia Panel: No results for input(s): VITAMINB12, FOLATE, FERRITIN, TIBC, IRON, RETICCTPCT in the last 72 hours. Urine analysis: No results found for: COLORURINE, APPEARANCEUR, LABSPEC, PHURINE, GLUCOSEU, HGBUR, BILIRUBINUR, KETONESUR, PROTEINUR, UROBILINOGEN, NITRITE, LEUKOCYTESUR  Radiological Exams on Admission: CT ABDOMEN PELVIS WO CONTRAST  Result Date: 03/15/2021 CLINICAL DATA:  Abdominal distension. Epigastric pain with nausea and vomiting. EXAM: CT ABDOMEN AND PELVIS WITHOUT CONTRAST  TECHNIQUE: Multidetector CT imaging of the abdomen and pelvis was performed following the standard protocol without IV contrast. COMPARISON:  CT abdomen pelvis 06/24/2014, ultrasound abdomen 09/04/2019 FINDINGS: Lower chest: Left lower lobe subsegmental atelectasis. Hepatobiliary: The hepatic parenchyma is diffusely hypodense compared to the splenic parenchyma consistent with fatty infiltration. Redemonstration of a poorly defined 3.4 x 3.8 x 4.7 cm left medial hepatic lobe lesion (new from 2015, but grossly similar compared to 2020). No focal liver abnormality. No gallstones, gallbladder wall thickening, or pericholecystic fluid. No biliary dilatation. Pancreas: No focal lesion. Poorly defined pancreatic contour with associated peripancreatic fat stranding. No main pancreatic ductal dilatation. No pseudocyst formation. Spleen: Normal in size without focal abnormality. A splenule is noted. Adrenals/Urinary Tract: No adrenal nodule bilaterally. No nephrolithiasis, no hydronephrosis, and  no contour-deforming renal mass. No ureterolithiasis or hydroureter. The urinary bladder is unremarkable. Stomach/Bowel: Stomach is within normal limits. No evidence of small bowel wall thickening or dilatation. Slight haziness of the hepatic flexure wall. Appendix appears normal. Vascular/Lymphatic: No abdominal aorta or iliac aneurysm. No abdominal, pelvic, or inguinal lymphadenopathy. Reproductive: Prostate is unremarkable. Other: No intraperitoneal free fluid. No intraperitoneal free gas. No organized fluid collection. Musculoskeletal: No abdominal wall hernia or abnormality. No suspicious lytic or blastic osseous lesions. No acute displaced fracture. IMPRESSION: 1. Acute pancreatitis.  No pseudocyst formation. 2. Likely associated reactive changes of the hepatic flexure. 3. Marked hepatic steatosis 4. Indeterminate poorly defined 3.4 x 3.8 x 4.7 cm left medial hepatic lobe lesion (new from 2015, but grossly similar compared to 2020). Recommend nonemergent MRI liver protocol for further evaluation. Electronically Signed   By: Tish Frederickson M.D.   On: 03/15/2021 06:04   DG Chest 2 View  Result Date: 03/14/2021 CLINICAL DATA:  Chest pain. EXAM: CHEST - 2 VIEW COMPARISON:  Chest x-ray 08/14/2020, CT chest 09/04/2019 FINDINGS: The heart size and mediastinal contours are within normal limits. No focal consolidation. No pulmonary edema. No pleural effusion. No pneumothorax. No acute osseous abnormality. IMPRESSION: No active cardiopulmonary disease. Electronically Signed   By: Tish Frederickson M.D.   On: 03/14/2021 22:22     Assessment/Plan Principal Problem:   Alcoholic pancreatitis Active Problems:   Fatty liver   Alcohol abuse   GERD (gastroesophageal reflux disease)     Acute alcoholic pancreatitis Keep patient n.p.o. IV fluid hydration Supportive care with pain control, antiemetics and IV PPI    History of alcohol dependence  Patient has been counseled on the need to abstain from  further alcohol use He is at risk of developing symptoms of alcohol withdrawal We will place patient on CIWA protocol and administer lorazepam for CIWA score of 8 or greater  DVT prophylaxis: Lovenox Code Status: full code Family Communication: Greater than 50% of time was spent discussing patient's condition and plan of care with him at the bedside.  All questions and concerns have been addressed.  He verbalizes understanding and agrees with the plan. Disposition Plan: Back to previous home environment Consults called: none Status: At the time of admission, it appears that the appropriate admission status for this patient is inpatient. This is judged to be reasonable and necessary in order to provide the required intensity of service to ensure the patient's safety given the presenting symptoms, physical exam findings and initial radiographic and laboratory data in the context of the comorbid conditions. Patient requires inpatient status due to high intensity of service, high risk for further deterioration and high frequency of surveillance required.    Jisele Price  Doratha Mcswain MD [facility]     03/15/2021, 8:49 AM

## 2021-03-16 ENCOUNTER — Encounter: Payer: Self-pay | Admitting: Internal Medicine

## 2021-03-16 LAB — CBC
HCT: 45.9 % (ref 39.0–52.0)
Hemoglobin: 15.6 g/dL (ref 13.0–17.0)
MCH: 32.9 pg (ref 26.0–34.0)
MCHC: 34 g/dL (ref 30.0–36.0)
MCV: 96.8 fL (ref 80.0–100.0)
Platelets: 207 10*3/uL (ref 150–400)
RBC: 4.74 MIL/uL (ref 4.22–5.81)
RDW: 15.7 % — ABNORMAL HIGH (ref 11.5–15.5)
WBC: 11.3 10*3/uL — ABNORMAL HIGH (ref 4.0–10.5)
nRBC: 0 % (ref 0.0–0.2)

## 2021-03-16 LAB — BASIC METABOLIC PANEL
Anion gap: 11 (ref 5–15)
BUN: 13 mg/dL (ref 6–20)
CO2: 18 mmol/L — ABNORMAL LOW (ref 22–32)
Calcium: 8.8 mg/dL — ABNORMAL LOW (ref 8.9–10.3)
Chloride: 102 mmol/L (ref 98–111)
Creatinine, Ser: 0.92 mg/dL (ref 0.61–1.24)
GFR, Estimated: >60 mL/min (ref >60)
Glucose, Bld: 79 mg/dL (ref 70–99)
Potassium: 4.1 mmol/L (ref 3.5–5.1)
Sodium: 131 mmol/L — ABNORMAL LOW (ref 135–145)

## 2021-03-16 MED ORDER — MORPHINE SULFATE (PF) 4 MG/ML IV SOLN
4.0000 mg | INTRAVENOUS | Status: DC | PRN
Start: 2021-03-16 — End: 2021-03-17
  Administered 2021-03-16 – 2021-03-17 (×5): 4 mg via INTRAVENOUS
  Filled 2021-03-16 (×5): qty 1

## 2021-03-16 MED ORDER — DEXTROSE IN LACTATED RINGERS 5 % IV SOLN: INTRAVENOUS | Status: DC

## 2021-03-16 MED ORDER — KETOROLAC TROMETHAMINE 30 MG/ML IJ SOLN
15.0000 mg | Freq: Once | INTRAMUSCULAR | Status: AC
  Administered 2021-03-16: 22:00:00 15 mg via INTRAVENOUS
  Filled 2021-03-16: qty 1

## 2021-03-16 NOTE — Progress Notes (Addendum)
Progress Note    Joshua Morton  LNL:[id number] DOB: 1996-08-22  DOA: 03/15/2021 PCP: Patient, No Pcp Per (Inactive)      Brief Narrative:    Medical records reviewed and are as summarized below:  Joshua Morton is a 25 y.o. male       Assessment/Plan:   Principal Problem:   Alcoholic pancreatitis Active Problems:   Fatty liver   Alcohol abuse   GERD (gastroesophageal reflux disease)   Body mass index is 25.1 kg/m.   Acute alcoholic pancreatitis: Continue n.p.o. status.  Restart IV fluids for hydration.  Analgesics as needed for pain.  Antiemetics as needed for nausea/vomiting.  Hyponatremia: Treated with IV fluids.  Monitor BMP.  Alcohol use disorder: Continue Ativan per CIWA protocol.  Counseled to quit drinking alcohol.  Left medial hepatic lobe lesion: Outpatient follow-up with MRI recommended.  Diet Order            Diet NPO time specified  Diet effective now                    Consultants:  None  Procedures:  None    Medications:   . enoxaparin (LOVENOX) injection  40 mg Subcutaneous Q24H  . melatonin  5 mg Oral QHS  . pantoprazole (PROTONIX) IV  40 mg Intravenous Q24H   Continuous Infusions: . dextrose 5% lactated ringers 125 mL/hr at 03/16/21 1109     Anti-infectives (From admission, onward)   None             Family Communication/Anticipated D/C date and plan/Code Status   DVT prophylaxis: enoxaparin (LOVENOX) injection 40 mg Start: 03/15/21 0900     Code Status: Full Code  Family Communication: His fiance at the bedside Disposition Plan:    Status is: Inpatient  Remains inpatient appropriate because:IV treatments appropriate due to intensity of illness or inability to take PO and Inpatient level of care appropriate due to severity of illness   Dispo: The patient is from: Home              Anticipated d/c is to: Home              Patient currently is not medically stable to d/c.    Difficult to place patient No           Subjective:   C/o upper abdominal pain mainly in the epigastric and left upper quadrant area.  No nausea or vomiting.  His fiance was at the bedside.  Objective:    Vitals:   03/15/21 2342 03/16/21 0538 03/16/21 0821 03/16/21 1210  BP: (!) 155/89 (!) 157/91 (!) 167/104 (!) 154/98  Pulse: (!) 109 99 (!) 108 99  Resp: 18 16 16 16   Temp: 98.6 F (37 C) 98.6 F (37 C) 98.8 F (37.1 C) 98 F (36.7 C)  TempSrc: Oral Oral Oral Oral  SpO2: 95% 99% 99% 99%  Weight:      Height:       No data found.   Intake/Output Summary (Last 24 hours) at 03/16/2021 1345 Last data filed at 03/16/2021 0000 Gross per 24 hour  Intake --  Output 700 ml  Net -700 ml   Filed Weights   03/14/21 2206  Weight: 81.6 kg    Exam:  GEN: NAD SKIN: No rash EYES: EOMI ENT: MMM CV: RRR PULM: CTA B ABD: soft, ND, epigastric and left lower quadrant tenderness, no rebound tenderness or guarding, +BS CNS: AAO x  3, non focal EXT: No edema or tenderness        Data Reviewed:   I have personally reviewed following labs and imaging studies:  Labs: Labs show the following:   Basic Metabolic Panel: Recent Labs  Lab 03/14/21 2210 03/15/21 1000 03/16/21 0604  NA 136  --  131*  K 3.7  --  4.1  CL 100  --  102  CO2 23  --  18*  GLUCOSE 93  --  79  BUN 17  --  13  CREATININE 0.95  --  0.92  CALCIUM 9.1  --  8.8*  MG  --  1.9  --   PHOS  --  3.5  --    GFR Estimated Creatinine Clearance: 129.6 mL/min (by C-G formula based on SCr of 0.92 mg/dL). Liver Function Tests: Recent Labs  Lab 03/15/21 0114  AST 39  ALT 54*  ALKPHOS 94  BILITOT 1.8*  PROT 8.0  ALBUMIN 4.3   Recent Labs  Lab 03/15/21 0114  LIPASE 182*   No results for input(s): AMMONIA in the last 168 hours. Coagulation profile No results for input(s): INR, PROTIME in the last 168 hours.  CBC: Recent Labs  Lab 03/14/21 2210 03/15/21 1000 03/16/21 0604  WBC 12.3*  14.3* 11.3*  HGB 15.9 15.8 15.6  HCT 45.6 45.2 45.9  MCV 93.4 94.8 96.8  PLT 256 238 207   Cardiac Enzymes: No results for input(s): CKTOTAL, CKMB, CKMBINDEX, TROPONINI in the last 168 hours. BNP (last 3 results) No results for input(s): PROBNP in the last 8760 hours. CBG: No results for input(s): GLUCAP in the last 168 hours. D-Dimer: No results for input(s): DDIMER in the last 72 hours. Hgb A1c: No results for input(s): HGBA1C in the last 72 hours. Lipid Profile: No results for input(s): CHOL, HDL, LDLCALC, TRIG, CHOLHDL, LDLDIRECT in the last 72 hours. Thyroid function studies: No results for input(s): TSH, T4TOTAL, T3FREE, THYROIDAB in the last 72 hours.  Invalid input(s): FREET3 Anemia work up: No results for input(s): VITAMINB12, FOLATE, FERRITIN, TIBC, IRON, RETICCTPCT in the last 72 hours. Sepsis Labs: Recent Labs  Lab 03/14/21 2210 03/15/21 1000 03/16/21 0604  WBC 12.3* 14.3* 11.3*    Microbiology Recent Results (from the past 240 hour(s))  Resp Panel by RT-PCR (Flu A&B, Covid) Nasopharyngeal Swab     Status: None   Collection Time: 03/15/21  6:34 AM   Specimen: Nasopharyngeal Swab; Nasopharyngeal(NP) swabs in vial transport medium  Result Value Ref Range Status   SARS Coronavirus 2 by RT PCR NEGATIVE NEGATIVE Final    Comment: (NOTE) SARS-CoV-2 target nucleic acids are NOT DETECTED.  The SARS-CoV-2 RNA is generally detectable in upper respiratory specimens during the acute phase of infection. The lowest concentration of SARS-CoV-2 viral copies this assay can detect is 138 copies/mL. A negative result does not preclude SARS-Cov-2 infection and should not be used as the sole basis for treatment or other patient management decisions. A negative result may occur with  improper specimen collection/handling, submission of specimen other than nasopharyngeal swab, presence of viral mutation(s) within the areas targeted by this assay, and inadequate number of  viral copies(<138 copies/mL). A negative result must be combined with clinical observations, patient history, and epidemiological information. The expected result is Negative.  Fact Sheet for Patients:  BloggerCourse.com  Fact Sheet for Healthcare Providers:  SeriousBroker.it  This test is no t yet approved or cleared by the [place] FDA and  has been authorized for detection  and/or diagnosis of SARS-CoV-2 by FDA under an Emergency Use Authorization (EUA). This EUA will remain  in effect (meaning this test can be used) for the duration of the COVID-19 declaration under Section 564(b)(1) of the Act, 21 U.S.C.section 360bbb-3(b)(1), unless the authorization is terminated  or revoked sooner.       Influenza A by PCR NEGATIVE NEGATIVE Final   Influenza B by PCR NEGATIVE NEGATIVE Final    Comment: (NOTE) The Xpert Xpress SARS-CoV-2/FLU/RSV plus assay is intended as an aid in the diagnosis of influenza from Nasopharyngeal swab specimens and should not be used as a sole basis for treatment. Nasal washings and aspirates are unacceptable for Xpert Xpress SARS-CoV-2/FLU/RSV testing.  Fact Sheet for Patients: BloggerCourse.com  Fact Sheet for Healthcare Providers: SeriousBroker.it  This test is not yet approved or cleared by the [place] FDA and has been authorized for detection and/or diagnosis of SARS-CoV-2 by FDA under an Emergency Use Authorization (EUA). This EUA will remain in effect (meaning this test can be used) for the duration of the COVID-19 declaration under Section 564(b)(1) of the Act, 21 U.S.C. section 360bbb-3(b)(1), unless the authorization is terminated or revoked.  Performed at [facility], [address], [place], [place] [address]     Procedures and diagnostic studies:  CT ABDOMEN PELVIS WO CONTRAST  Result Date: 03/15/2021 CLINICAL  DATA:  Abdominal distension. Epigastric pain with nausea and vomiting. EXAM: CT ABDOMEN AND PELVIS WITHOUT CONTRAST TECHNIQUE: Multidetector CT imaging of the abdomen and pelvis was performed following the standard protocol without IV contrast. COMPARISON:  CT abdomen pelvis 06/24/2014, ultrasound abdomen 09/04/2019 FINDINGS: Lower chest: Left lower lobe subsegmental atelectasis. Hepatobiliary: The hepatic parenchyma is diffusely hypodense compared to the splenic parenchyma consistent with fatty infiltration. Redemonstration of a poorly defined 3.4 x 3.8 x 4.7 cm left medial hepatic lobe lesion (new from 2015, but grossly similar compared to 2020). No focal liver abnormality. No gallstones, gallbladder wall thickening, or pericholecystic fluid. No biliary dilatation. Pancreas: No focal lesion. Poorly defined pancreatic contour with associated peripancreatic fat stranding. No main pancreatic ductal dilatation. No pseudocyst formation. Spleen: Normal in size without focal abnormality. A splenule is noted. Adrenals/Urinary Tract: No adrenal nodule bilaterally. No nephrolithiasis, no hydronephrosis, and no contour-deforming renal mass. No ureterolithiasis or hydroureter. The urinary bladder is unremarkable. Stomach/Bowel: Stomach is within normal limits. No evidence of small bowel wall thickening or dilatation. Slight haziness of the hepatic flexure wall. Appendix appears normal. Vascular/Lymphatic: No abdominal aorta or iliac aneurysm. No abdominal, pelvic, or inguinal lymphadenopathy. Reproductive: Prostate is unremarkable. Other: No intraperitoneal free fluid. No intraperitoneal free gas. No organized fluid collection. Musculoskeletal: No abdominal wall hernia or abnormality. No suspicious lytic or blastic osseous lesions. No acute displaced fracture. IMPRESSION: 1. Acute pancreatitis.  No pseudocyst formation. 2. Likely associated reactive changes of the hepatic flexure. 3. Marked hepatic steatosis 4. Indeterminate  poorly defined 3.4 x 3.8 x 4.7 cm left medial hepatic lobe lesion (new from 2015, but grossly similar compared to 2020). Recommend nonemergent MRI liver protocol for further evaluation. Electronically Signed   By: Tish Frederickson M.D.   On: 03/15/2021 06:04   DG Chest 2 View  Result Date: 03/14/2021 CLINICAL DATA:  Chest pain. EXAM: CHEST - 2 VIEW COMPARISON:  Chest x-ray 08/14/2020, CT chest 09/04/2019 FINDINGS: The heart size and mediastinal contours are within normal limits. No focal consolidation. No pulmonary edema. No pleural effusion. No pneumothorax. No acute osseous abnormality. IMPRESSION: No active cardiopulmonary disease. Electronically Signed  By: Tish FredericksonMorgane  Naveau M.D.   On: 03/14/2021 22:22               LOS: 1 day   Mylo Choi  [facility]   Pager on [email or web address]. If 7PM-7AM, please contact night-coverage at [email or web address]     03/16/2021, 1:45 PM

## 2021-03-16 NOTE — Plan of Care (Signed)
End of Shift Summary:  Alert and oriented x4. Sinus tachycardia up to 110s, other VSS. Denies n/v. Pain managed with prn medications. Pt would like to be considered for a diet order. Urine output adequate. Remained free from falls or injury. Bed low and in locked position. Call bell within reach and able to use  Problem: Health Behavior/Discharge Planning: Goal: Ability to manage health-related needs will improve Outcome: Progressing   Problem: Clinical Measurements: Goal: Ability to maintain clinical measurements within normal limits will improve Outcome: Progressing   Problem: Clinical Measurements: Goal: Cardiovascular complication will be avoided Outcome: Progressing   Problem: Nutrition: Goal: Adequate nutrition will be maintained Outcome: Progressing   Problem: Coping: Goal: Level of anxiety will decrease Outcome: Progressing   Problem: Pain Managment: Goal: General experience of comfort will improve Outcome: Progressing

## 2021-03-17 ENCOUNTER — Encounter: Payer: Self-pay | Admitting: Internal Medicine

## 2021-03-17 LAB — BASIC METABOLIC PANEL
Anion gap: 6 (ref 5–15)
BUN: 10 mg/dL (ref 6–20)
CO2: 27 mmol/L (ref 22–32)
Calcium: 8.9 mg/dL (ref 8.9–10.3)
Chloride: 104 mmol/L (ref 98–111)
Creatinine, Ser: 0.82 mg/dL (ref 0.61–1.24)
GFR, Estimated: >60 mL/min (ref >60)
Glucose, Bld: 114 mg/dL — ABNORMAL HIGH (ref 70–99)
Potassium: 3.5 mmol/L (ref 3.5–5.1)
Sodium: 137 mmol/L (ref 135–145)

## 2021-03-17 LAB — MAGNESIUM: Magnesium: 2 mg/dL (ref 1.7–2.4)

## 2021-03-17 LAB — PHOSPHORUS: Phosphorus: 3.1 mg/dL (ref 2.5–4.6)

## 2021-03-17 MED ORDER — KETOROLAC TROMETHAMINE 30 MG/ML IJ SOLN
15.0000 mg | Freq: Once | INTRAMUSCULAR | Status: AC
  Administered 2021-03-17: 15 mg via INTRAVENOUS
  Filled 2021-03-17: qty 1

## 2021-03-17 MED ORDER — ACETAMINOPHEN 325 MG PO TABS
650.0000 mg | ORAL_TABLET | Freq: Four times a day (QID) | ORAL | Status: DC | PRN
  Administered 2021-03-17: 650 mg via ORAL
  Filled 2021-03-17: qty 2

## 2021-03-17 NOTE — TOC Progression Note (Signed)
Transition of Care [facility]) - Progression Note    Patient Details  Name: Joshua Morton MRN: [id number] Date of Birth: November 13, 1995  [facility] CM/SW Contact  Caryn Section, RN Phone Number: 03/17/2021, 10:57 AM  Clinical Narrative:   Patient states he thought he had Medicaid, but apparently does not have insurance.  Patient has no PCP, referral made to [facility].  Paperwork for [facility] given, substance use/abuse resources given.  No further questions, TOC contact information given to patient.         Expected Discharge Plan and Services                                                 Social Determinants of Health (SDOH) Interventions    Readmission Risk Interventions No flowsheet data found.

## 2021-03-17 NOTE — Discharge Summary (Addendum)
Physician Discharge Summary  Culley Hedeen EXH:[id number] DOB: 11/09/1995 DOA: 03/15/2021  PCP: Patient, No Pcp Per (Inactive)  Admit date: 03/15/2021 Discharge date: 03/17/2021  Discharge disposition: Home   Recommendations for Outpatient Follow-Up:   Follow with a primary care physician in 1 to 2 weeks.  [facility]   Discharge Diagnosis:   Principal Problem:   Alcoholic pancreatitis Active Problems:   Fatty liver   Alcohol abuse   GERD (gastroesophageal reflux disease)    Discharge Condition: Stable.  Diet recommendation:  Diet Order            Diet Heart Room service appropriate? Yes; Fluid consistency: Thin  Diet effective now           Diet - low sodium heart healthy                   Code Status: Full Code     Hospital Course:   [name] is a 25 year old man with medical history significant for alcohol use disorder, alcoholic pancreatitis, GERD, who presented to the hospital because of upper abdominal pain, nausea and vomiting.  He was admitted to the hospital for acute alcoholic pancreatitis and hyponatremia.  He was treated with bowel rest, IV fluids, analgesics.  He was also treated with Ativan for alcohol use disorder.  His condition has improved and he has been able to tolerate a diet without any problems.  Left medial hepatic lobe lesion was found on CT scan of the abdomen and pelvis.  This was discussed with the patient and his fiance at the bedside.  Outpatient follow-up with MRI was recommended.  He has been advised to stop drinking alcohol to reduce the risk of complications including but not limited to cardiomyopathy, liver cirrhosis and recurrent pancreatitis.       Discharge Exam:    Vitals:   03/17/21 0111 03/17/21 0434 03/17/21 0740 03/17/21 1146  BP: 121/72 (!) 150/81 124/68 (!) 136/99  Pulse: 88 87 87 83  Resp: 16 17 17 17   Temp: 98.3 F (36.8 C) 98 F (36.7 C) 98.2 F (36.8  C) 97.6 F (36.4 C)  TempSrc: Oral Oral Oral Oral  SpO2: 96% 98% 100% 99%  Weight:      Height:         GEN: NAD SKIN: No rash EYES: EOMI ENT: MMM CV: RRR PULM: CTA B ABD: soft, ND, NT, +BS CNS: AAO x 3, non focal EXT: No edema or tenderness   The results of significant diagnostics from this hospitalization (including imaging, microbiology, ancillary and laboratory) are listed below for reference.     Procedures and Diagnostic Studies:   CT ABDOMEN PELVIS WO CONTRAST  Result Date: 03/15/2021 CLINICAL DATA:  Abdominal distension. Epigastric pain with nausea and vomiting. EXAM: CT ABDOMEN AND PELVIS WITHOUT CONTRAST TECHNIQUE: Multidetector CT imaging of the abdomen and pelvis was performed following the standard protocol without IV contrast. COMPARISON:  CT abdomen pelvis 06/24/2014, ultrasound abdomen 09/04/2019 FINDINGS: Lower chest: Left lower lobe subsegmental atelectasis. Hepatobiliary: The hepatic parenchyma is diffusely hypodense compared to the splenic parenchyma consistent with fatty infiltration. Redemonstration of a poorly defined 3.4 x 3.8 x 4.7 cm left medial hepatic lobe lesion (new from 2015, but grossly similar compared to 2020). No focal liver abnormality. No gallstones, gallbladder wall thickening, or pericholecystic fluid. No biliary dilatation. Pancreas: No focal lesion. Poorly defined pancreatic contour with associated peripancreatic fat stranding. No main pancreatic ductal dilatation.  No pseudocyst formation. Spleen: Normal in size without focal abnormality. A splenule is noted. Adrenals/Urinary Tract: No adrenal nodule bilaterally. No nephrolithiasis, no hydronephrosis, and no contour-deforming renal mass. No ureterolithiasis or hydroureter. The urinary bladder is unremarkable. Stomach/Bowel: Stomach is within normal limits. No evidence of small bowel wall thickening or dilatation. Slight haziness of the hepatic flexure wall. Appendix appears normal.  Vascular/Lymphatic: No abdominal aorta or iliac aneurysm. No abdominal, pelvic, or inguinal lymphadenopathy. Reproductive: Prostate is unremarkable. Other: No intraperitoneal free fluid. No intraperitoneal free gas. No organized fluid collection. Musculoskeletal: No abdominal wall hernia or abnormality. No suspicious lytic or blastic osseous lesions. No acute displaced fracture. IMPRESSION: 1. Acute pancreatitis.  No pseudocyst formation. 2. Likely associated reactive changes of the hepatic flexure. 3. Marked hepatic steatosis 4. Indeterminate poorly defined 3.4 x 3.8 x 4.7 cm left medial hepatic lobe lesion (new from 2015, but grossly similar compared to 2020). Recommend nonemergent MRI liver protocol for further evaluation. Electronically Signed   By: Tish Frederickson M.D.   On: 03/15/2021 06:04     Labs:   Basic Metabolic Panel: Recent Labs  Lab 03/14/21 2210 03/15/21 1000 03/16/21 0604 03/17/21 0631  NA 136  --  131* 137  K 3.7  --  4.1 3.5  CL 100  --  102 104  CO2 23  --  18* 27  GLUCOSE 93  --  79 114*  BUN 17  --  13 10  CREATININE 0.95  --  0.92 0.82  CALCIUM 9.1  --  8.8* 8.9  MG  --  1.9  --  2.0  PHOS  --  3.5  --  3.1   GFR Estimated Creatinine Clearance: 145.4 mL/min (by C-G formula based on SCr of 0.82 mg/dL). Liver Function Tests: Recent Labs  Lab 03/15/21 0114  AST 39  ALT 54*  ALKPHOS 94  BILITOT 1.8*  PROT 8.0  ALBUMIN 4.3   Recent Labs  Lab 03/15/21 0114  LIPASE 182*   No results for input(s): AMMONIA in the last 168 hours. Coagulation profile No results for input(s): INR, PROTIME in the last 168 hours.  CBC: Recent Labs  Lab 03/14/21 2210 03/15/21 1000 03/16/21 0604  WBC 12.3* 14.3* 11.3*  HGB 15.9 15.8 15.6  HCT 45.6 45.2 45.9  MCV 93.4 94.8 96.8  PLT 256 238 207   Cardiac Enzymes: No results for input(s): CKTOTAL, CKMB, CKMBINDEX, TROPONINI in the last 168 hours. BNP: Invalid input(s): POCBNP CBG: No results for input(s): GLUCAP in  the last 168 hours. D-Dimer No results for input(s): DDIMER in the last 72 hours. Hgb A1c No results for input(s): HGBA1C in the last 72 hours. Lipid Profile No results for input(s): CHOL, HDL, LDLCALC, TRIG, CHOLHDL, LDLDIRECT in the last 72 hours. Thyroid function studies No results for input(s): TSH, T4TOTAL, T3FREE, THYROIDAB in the last 72 hours.  Invalid input(s): FREET3 Anemia work up No results for input(s): VITAMINB12, FOLATE, FERRITIN, TIBC, IRON, RETICCTPCT in the last 72 hours. Microbiology Recent Results (from the past 240 hour(s))  Resp Panel by RT-PCR (Flu A&B, Covid) Nasopharyngeal Swab     Status: None   Collection Time: 03/15/21  6:34 AM   Specimen: Nasopharyngeal Swab; Nasopharyngeal(NP) swabs in vial transport medium  Result Value Ref Range Status   SARS Coronavirus 2 by RT PCR NEGATIVE NEGATIVE Final    Comment: (NOTE) SARS-CoV-2 target nucleic acids are NOT DETECTED.  The SARS-CoV-2 RNA is generally detectable in upper respiratory specimens during the acute phase  of infection. The lowest concentration of SARS-CoV-2 viral copies this assay can detect is 138 copies/mL. A negative result does not preclude SARS-Cov-2 infection and should not be used as the sole basis for treatment or other patient management decisions. A negative result may occur with  improper specimen collection/handling, submission of specimen other than nasopharyngeal swab, presence of viral mutation(s) within the areas targeted by this assay, and inadequate number of viral copies(<138 copies/mL). A negative result must be combined with clinical observations, patient history, and epidemiological information. The expected result is Negative.  Fact Sheet for Patients:  BloggerCourse.com  Fact Sheet for Healthcare Providers:  SeriousBroker.it  This test is no t yet approved or cleared by the [place] FDA and  has been authorized for  detection and/or diagnosis of SARS-CoV-2 by FDA under an Emergency Use Authorization (EUA). This EUA will remain  in effect (meaning this test can be used) for the duration of the COVID-19 declaration under Section 564(b)(1) of the Act, 21 U.S.C.section 360bbb-3(b)(1), unless the authorization is terminated  or revoked sooner.       Influenza A by PCR NEGATIVE NEGATIVE Final   Influenza B by PCR NEGATIVE NEGATIVE Final    Comment: (NOTE) The Xpert Xpress SARS-CoV-2/FLU/RSV plus assay is intended as an aid in the diagnosis of influenza from Nasopharyngeal swab specimens and should not be used as a sole basis for treatment. Nasal washings and aspirates are unacceptable for Xpert Xpress SARS-CoV-2/FLU/RSV testing.  Fact Sheet for Patients: BloggerCourse.com  Fact Sheet for Healthcare Providers: SeriousBroker.it  This test is not yet approved or cleared by the [place] FDA and has been authorized for detection and/or diagnosis of SARS-CoV-2 by FDA under an Emergency Use Authorization (EUA). This EUA will remain in effect (meaning this test can be used) for the duration of the COVID-19 declaration under Section 564(b)(1) of the Act, 21 U.S.C. section 360bbb-3(b)(1), unless the authorization is terminated or revoked.  Performed at [facility], [address], [place], [place] [address]      Discharge Instructions:   Discharge Instructions    Diet - low sodium heart healthy   Complete by: As directed    Discharge instructions   Complete by: As directed    Please avoid alcohol, cigarettes or any illicit drugs   Increase activity slowly   Complete by: As directed      Allergies as of 03/17/2021   No Known Allergies     Medication List    STOP taking these medications   acetaminophen 500 MG tablet Commonly known as: TYLENOL   ibuprofen 200 MG tablet Commonly known as: ADVIL         Time  coordinating discharge: 28 minutes  Signed:  Iyari Hagner  [facility] 03/17/2021, 1:42 PM   Pager on [email or web address]. If 7PM-7AM, please contact night-coverage at [email or web address]

## 2021-03-17 NOTE — Plan of Care (Signed)
End of Shift Summary:  Alert and oriented x4. One episode of HTN, treated with prn hydralazine x1. Other VSS. IVF maintained. Denies n/v. Pain managed with prn medications. Toradol given x1 for headache. Urine output adequate. Remained free from falls or injury. Call bell within reach and able to use.  Problem: Health Behavior/Discharge Planning: Goal: Ability to manage health-related needs will improve Outcome: Progressing   Problem: Clinical Measurements: Goal: Ability to maintain clinical measurements within normal limits will improve Outcome: Progressing Goal: Will remain free from infection Outcome: Progressing Goal: Diagnostic test results will improve Outcome: Progressing Goal: Respiratory complications will improve Outcome: Progressing Goal: Cardiovascular complication will be avoided Outcome: Progressing   Problem: Pain Managment: Goal: General experience of comfort will improve Outcome: Progressing

## 2021-04-03 ENCOUNTER — Telehealth: Payer: Self-pay | Admitting: Gerontology

## 2021-04-05 NOTE — Telephone Encounter (Signed)
An attempt to contact the pt was made but the voicemail box was not set up.

## 2021-06-21 ENCOUNTER — Other Ambulatory Visit: Payer: Self-pay

## 2021-06-21 ENCOUNTER — Inpatient Hospital Stay
Admission: RE | Admit: 2021-06-21 | Discharge: 2021-06-21 | Disposition: A | Discharge status: Home / Self Care | Payer: Self-pay | Source: Ambulatory Visit

## 2021-06-21 ENCOUNTER — Ambulatory Visit
Admission: EM | Admit: 2021-06-21 | Discharge: 2021-06-21 | Disposition: A | Discharge status: Home / Self Care | Payer: BC Managed Care – PPO | Attending: Family Medicine | Admitting: Family Medicine

## 2021-06-21 ENCOUNTER — Encounter: Payer: Self-pay | Admitting: Emergency Medicine

## 2021-06-21 DIAGNOSIS — Z20822 Contact with and (suspected) exposure to covid-19: Secondary | ICD-10-CM | POA: Insufficient documentation

## 2021-06-21 DIAGNOSIS — J069 Acute upper respiratory infection, unspecified: Secondary | ICD-10-CM | POA: Insufficient documentation

## 2021-06-21 MED ORDER — IPRATROPIUM BROMIDE 0.06 % NA SOLN: 2.0000 | Freq: Four times a day (QID) | NASAL | 12 refills | Status: DC

## 2021-06-21 MED ORDER — PROMETHAZINE-DM 6.25-15 MG/5ML PO SYRP: 5.0000 mL | ORAL_SOLUTION | Freq: Four times a day (QID) | ORAL | 0 refills | Status: DC | PRN

## 2021-06-21 MED ORDER — BENZONATATE 100 MG PO CAPS: 200.0000 mg | ORAL_CAPSULE | Freq: Three times a day (TID) | ORAL | 0 refills | Status: DC

## 2021-06-21 NOTE — ED Provider Notes (Signed)
[facility]    CSN: [id number] Arrival date & time: 06/21/21  1826      History   Chief Complaint Chief Complaint  Patient presents with   Appointment   Nasal Congestion   Cough    HPI Joshua Morton is a 25 y.o. male.   HPI  18 old male here for evaluation of nasal congestion and cough.  Patient's had the above mentioned symptoms for the last 6 days.  These are associated with ear pressure and muffled hearing.  He is also had some sweats at night.  His nasal discharge has been green but has now gone to a clear.  He is having a nonproductive cough and sore throat as well.  He denies fever, shortness breath or wheezing, or GI complaints.  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  Past Medical History:  Diagnosis Date   Acute pancreatitis 09/04/2019   Acute respiratory failure with hypoxia (HCC)    Attempted suicide (HCC)    GERD (gastroesophageal reflux disease) 09/04/2019   Pancreatitis, alcoholic, acute 09/04/2019    Patient Active Problem List   Diagnosis Date Noted   Alcoholic pancreatitis 03/15/2021   Essential hypertension    Tachycardia    Abdominal pain    Fatty liver 09/04/2019   Alcohol abuse 09/04/2019   GERD (gastroesophageal reflux disease) 09/04/2019    History reviewed. No pertinent surgical history.     Home Medications    Prior to Admission medications   Medication Sig Start Date End Date Taking? Authorizing Provider  benzonatate (TESSALON) 100 MG capsule Take 2 capsules (200 mg total) by mouth every 8 (eight) hours. 06/21/21  Yes Becky Augusta, NP  ipratropium (ATROVENT) 0.06 % nasal spray Place 2 sprays into both nostrils 4 (four) times daily. 06/21/21  Yes Becky Augusta, NP  promethazine-dextromethorphan (PROMETHAZINE-DM) 6.25-15 MG/5ML syrup Take 5 mLs by mouth 4 (four) times daily as needed. 06/21/21  Yes Becky Augusta, NP    Family  History Family History  Problem Relation Age of Onset   Hypertension Mother     Social History Social History   Tobacco Use   Smoking status: Former    Packs/day: 0.10    Types: Cigarettes   Smokeless tobacco: Current   Tobacco comments:    vapes enough for about 1-2 cigarettes/day  Vaping Use   Vaping Use: Every day  Substance Use Topics   Alcohol use: Yes   Drug use: No     Allergies   Patient has no known allergies.   Review of Systems Review of Systems  Constitutional:  Negative for activity change, appetite change and fever.  HENT:  Positive for congestion, ear pain, rhinorrhea, sinus pressure and sore throat.   Respiratory:  Positive for cough. Negative for shortness of breath and wheezing.   Gastrointestinal:  Negative for diarrhea, nausea and vomiting.  Musculoskeletal:  Negative for arthralgias and myalgias.  Skin:  Negative for rash.  Hematological: Negative.   Psychiatric/Behavioral: Negative.      Physical Exam Triage Vital Signs ED Triage Vitals  Enc Vitals Group     BP 06/21/21 1918 (!) 142/97     Pulse Rate 06/21/21 1918 (!) 110     Resp 06/21/21 1918 18     Temp 06/21/21 1918 98.1 F (36.7 C)     Temp Source 06/21/21 1918 Oral     SpO2 06/21/21 1918 98 %  Weight 06/21/21 1915 179 lb 14.3 oz (81.6 kg)     Height 06/21/21 1915 5\' 11"  (1.803 m)     Head Circumference --      Peak Flow --      Pain Score 06/21/21 1914 0     Pain Loc --      Pain Edu? --      Excl. in GC? --    No data found.  Updated Vital Signs BP (!) 142/97 (BP Location: Left Arm)   Pulse (!) 110   Temp 98.1 F (36.7 C) (Oral)   Resp 18   Ht 5\' 11"  (1.803 m)   Wt 179 lb 14.3 oz (81.6 kg)   SpO2 98%   BMI 25.09 kg/m   Visual Acuity Right Eye Distance:   Left Eye Distance:   Bilateral Distance:    Right Eye Near:   Left Eye Near:    Bilateral Near:     Physical Exam Vitals and nursing note reviewed.  Constitutional:      General: He is not in acute  distress.    Appearance: Normal appearance. He is normal weight. He is not ill-appearing.  HENT:     Head: Normocephalic and atraumatic.     Right Ear: Tympanic membrane, ear canal and external ear normal. There is no impacted cerumen.     Left Ear: Tympanic membrane, ear canal and external ear normal. There is no impacted cerumen.     Nose: Congestion and rhinorrhea present.     Mouth/Throat:     Mouth: Mucous membranes are moist.     Pharynx: Oropharynx is clear. Posterior oropharyngeal erythema present.  Cardiovascular:     Rate and Rhythm: Normal rate and regular rhythm.     Pulses: Normal pulses.     Heart sounds: Normal heart sounds. No murmur heard.   No gallop.  Pulmonary:     Effort: Pulmonary effort is normal.     Breath sounds: Normal breath sounds. No wheezing, rhonchi or rales.  Musculoskeletal:     Cervical back: Normal range of motion and neck supple.  Lymphadenopathy:     Cervical: No cervical adenopathy.  Skin:    General: Skin is warm and dry.     Capillary Refill: Capillary refill takes less than 2 seconds.     Findings: No erythema or rash.  Neurological:     General: No focal deficit present.     Mental Status: He is alert and oriented to person, place, and time.  Psychiatric:        Mood and Affect: Mood normal.        Behavior: Behavior normal.        Thought Content: Thought content normal.        Judgment: Judgment normal.     UC Treatments / Results  Labs (all labs ordered are listed, but only abnormal results are displayed) Labs Reviewed  SARS CORONAVIRUS 2 (TAT 6-24 HRS)    EKG   Radiology No results found.  Procedures Procedures (including critical care time)  Medications Ordered in UC Medications - No data to display  Initial Impression / Assessment and Plan / UC Course  I have reviewed the triage vital signs and the nursing notes.  Pertinent labs & imaging results that were available during my care of the patient were reviewed  by me and considered in my medical decision making (see chart for details).  25 year old male here for evaluation of respiratory complaints as outlined in the  HPI above.  Patient's physical exam reveals pearly gray tympanic membranes bilaterally with normal light reflex and clear external auditory canals.  Nasal mucosa is erythematous edematous with clear nasal discharge.  Oropharyngeal exam reveals posterior oropharyngeal erythema with clear postnasal drip.  Patient has no cervical lymphadenopathy appreciated exam.  Cardiopulmonary exam reveals clear lung sounds in all fields.  Patient exam is consistent with a viral URI with a cough.  We will treat with Atrovent nasal spray, Tessalon Perles, and Promethazine DM cough syrup.  Patient was swabbed for COVID at triage.  He is beyond his quarantine window as he is on day 6 of symptoms.  Work note provided.   Final Clinical Impressions(s) / UC Diagnoses   Final diagnoses:  Viral URI with cough     Discharge Instructions      Use the Atrovent nasal spray, 2 squirts in each nostril every 6 hours, as needed for runny nose and postnasal drip.  Use the Tessalon Perles every 8 hours during the day.  Take them with a small sip of water.  They may give you some numbness to the base of your tongue or a metallic taste in your mouth, this is normal.  Use the Promethazine DM cough syrup at bedtime for cough and congestion.  It will make you drowsy so do not take it during the day.  Return for reevaluation or see your primary care provider for any new or worsening symptoms.      ED Prescriptions     Medication Sig Dispense Auth. Provider   benzonatate (TESSALON) 100 MG capsule Take 2 capsules (200 mg total) by mouth every 8 (eight) hours. 21 capsule Becky Augusta, NP   ipratropium (ATROVENT) 0.06 % nasal spray Place 2 sprays into both nostrils 4 (four) times daily. 15 mL Becky Augusta, NP   promethazine-dextromethorphan (PROMETHAZINE-DM) 6.25-15 MG/5ML  syrup Take 5 mLs by mouth 4 (four) times daily as needed. 118 mL Becky Augusta, NP      PDMP not reviewed this encounter.   Becky Augusta, NP 06/21/21 2007

## 2021-06-21 NOTE — Discharge Instructions (Addendum)

## 2021-06-21 NOTE — ED Triage Notes (Signed)
Pt c/o nasal congestion, cough. Started about 6 days ago. Denies fever. At home covid test negative.

## 2021-06-22 LAB — SARS CORONAVIRUS 2 (TAT 6-24 HRS): SARS Coronavirus 2: NEGATIVE

## 2021-06-23 ENCOUNTER — Emergency Department: Payer: BC Managed Care – PPO

## 2021-06-23 ENCOUNTER — Other Ambulatory Visit: Payer: Self-pay

## 2021-06-23 ENCOUNTER — Emergency Department
Admission: EM | Admit: 2021-06-23 | Discharge: 2021-06-23 | Disposition: A | Discharge status: Home / Self Care | Payer: BC Managed Care – PPO | Attending: Student in an Organized Health Care Education/Training Program | Admitting: Student in an Organized Health Care Education/Training Program

## 2021-06-23 DIAGNOSIS — R059 Cough, unspecified: Secondary | ICD-10-CM | POA: Diagnosis not present

## 2021-06-23 DIAGNOSIS — R0981 Nasal congestion: Secondary | ICD-10-CM | POA: Insufficient documentation

## 2021-06-23 DIAGNOSIS — R112 Nausea with vomiting, unspecified: Secondary | ICD-10-CM

## 2021-06-23 DIAGNOSIS — K852 Alcohol induced acute pancreatitis without necrosis or infection: Secondary | ICD-10-CM | POA: Diagnosis not present

## 2021-06-23 DIAGNOSIS — F17200 Nicotine dependence, unspecified, uncomplicated: Secondary | ICD-10-CM | POA: Insufficient documentation

## 2021-06-23 DIAGNOSIS — I10 Essential (primary) hypertension: Secondary | ICD-10-CM | POA: Diagnosis not present

## 2021-06-23 LAB — HEPATIC FUNCTION PANEL
ALT: 68 U/L — ABNORMAL HIGH (ref 0–44)
AST: 55 U/L — ABNORMAL HIGH (ref 15–41)
Albumin: 4.6 g/dL (ref 3.5–5.0)
Alkaline Phosphatase: 84 U/L (ref 38–126)
Bilirubin, Direct: 0.4 mg/dL — ABNORMAL HIGH (ref 0.0–0.2)
Indirect Bilirubin: 1.4 mg/dL — ABNORMAL HIGH (ref 0.3–0.9)
Total Bilirubin: 1.8 mg/dL — ABNORMAL HIGH (ref 0.3–1.2)
Total Protein: 8.7 g/dL — ABNORMAL HIGH (ref 6.5–8.1)

## 2021-06-23 LAB — CBC
HCT: 49.2 % (ref 39.0–52.0)
Hemoglobin: 17.3 g/dL — ABNORMAL HIGH (ref 13.0–17.0)
MCH: 33.7 pg (ref 26.0–34.0)
MCHC: 35.2 g/dL (ref 30.0–36.0)
MCV: 95.9 fL (ref 80.0–100.0)
Platelets: 328 10*3/uL (ref 150–400)
RBC: 5.13 MIL/uL (ref 4.22–5.81)
RDW: 11.8 % (ref 11.5–15.5)
WBC: 15.9 10*3/uL — ABNORMAL HIGH (ref 4.0–10.5)
nRBC: 0 % (ref 0.0–0.2)

## 2021-06-23 LAB — BASIC METABOLIC PANEL
Anion gap: 12 (ref 5–15)
BUN: 18 mg/dL (ref 6–20)
CO2: 25 mmol/L (ref 22–32)
Calcium: 9.2 mg/dL (ref 8.9–10.3)
Chloride: 101 mmol/L (ref 98–111)
Creatinine, Ser: 1.12 mg/dL (ref 0.61–1.24)
GFR, Estimated: >60 mL/min (ref >60)
Glucose, Bld: 123 mg/dL — ABNORMAL HIGH (ref 70–99)
Potassium: 4.1 mmol/L (ref 3.5–5.1)
Sodium: 138 mmol/L (ref 135–145)

## 2021-06-23 LAB — TROPONIN I (HIGH SENSITIVITY): Troponin I (High Sensitivity): 3 ng/L (ref <18)

## 2021-06-23 LAB — LIPASE, BLOOD: Lipase: 229 U/L — ABNORMAL HIGH (ref 11–51)

## 2021-06-23 MED ORDER — MORPHINE SULFATE (PF) 4 MG/ML IV SOLN
4.0000 mg | INTRAVENOUS | Status: DC | PRN
  Administered 2021-06-23: 4 mg via INTRAVENOUS
  Filled 2021-06-23: qty 1

## 2021-06-23 MED ORDER — HYDROCODONE-ACETAMINOPHEN 5-325 MG PO TABS
1.0000 | ORAL_TABLET | Freq: Once | ORAL | Status: AC
  Administered 2021-06-23: 1 via ORAL
  Filled 2021-06-23: qty 1

## 2021-06-23 MED ORDER — OXYCODONE-ACETAMINOPHEN 5-325 MG PO TABS: 1.0000 | ORAL_TABLET | ORAL | 0 refills | Status: AC | PRN

## 2021-06-23 MED ORDER — ONDANSETRON 4 MG PO TBDP
4.0000 mg | ORAL_TABLET | Freq: Once | ORAL | Status: AC
  Administered 2021-06-23: 4 mg via ORAL
  Filled 2021-06-23: qty 1

## 2021-06-23 MED ORDER — SODIUM CHLORIDE 0.9 % IV BOLUS
1000.0000 mL | Freq: Once | INTRAVENOUS | Status: AC
  Administered 2021-06-23: 1000 mL via INTRAVENOUS

## 2021-06-23 MED ORDER — KETOROLAC TROMETHAMINE 30 MG/ML IJ SOLN
15.0000 mg | Freq: Once | INTRAMUSCULAR | Status: AC
  Administered 2021-06-23: 15 mg via INTRAVENOUS
  Filled 2021-06-23: qty 1

## 2021-06-23 MED ORDER — METOCLOPRAMIDE HCL 10 MG PO TABS: 10.0000 mg | ORAL_TABLET | Freq: Four times a day (QID) | ORAL | 0 refills | Status: DC | PRN

## 2021-06-23 MED ORDER — METOCLOPRAMIDE HCL 5 MG/ML IJ SOLN
10.0000 mg | Freq: Once | INTRAMUSCULAR | Status: AC
  Administered 2021-06-23: 10 mg via INTRAVENOUS
  Filled 2021-06-23: qty 2

## 2021-06-23 NOTE — ED Triage Notes (Signed)
Pt was seen at [facility] on Thursday and diagnosed with an upper respiratory infection- pt states since then he has had increased chest pain and vomiting- pt is unable to cough up any mucous- pt was given nasal spray and cough syrup, but those have not been helping

## 2021-06-23 NOTE — ED Provider Notes (Signed)
[facility] Emergency Department Provider Note    Event Date/Time   First MD Initiated Contact with Patient 06/23/21 1155     (approximate)  I have reviewed the triage vital signs and the nursing notes.   HISTORY  Chief Complaint Chest Pain and Emesis    HPI Joshua Morton is a 25 y.o. male with below listed past medical history presents to the ER for evaluation of cough congestion nausea vomiting epigastric discomfort.  States he was recently seen at urgent care without any improvement with symptomatic management.  Has had 3 negative COVID test.  Has several coworkers that are also sick, treated for sinusitis with improvement.  Does not have any pain radiating through to his back.  He does vape.  Past Medical History:  Diagnosis Date   Acute pancreatitis 09/04/2019   Acute respiratory failure with hypoxia (HCC)    Attempted suicide (HCC)    GERD (gastroesophageal reflux disease) 09/04/2019   Pancreatitis, alcoholic, acute 09/04/2019   Family History  Problem Relation Age of Onset   Hypertension Mother    History reviewed. No pertinent surgical history. Patient Active Problem List   Diagnosis Date Noted   Alcoholic pancreatitis 03/15/2021   Essential hypertension    Tachycardia    Abdominal pain    Fatty liver 09/04/2019   Alcohol abuse 09/04/2019   GERD (gastroesophageal reflux disease) 09/04/2019      Prior to Admission medications   Medication Sig Start Date End Date Taking? Authorizing Provider  metoCLOPramide (REGLAN) 10 MG tablet Take 1 tablet (10 mg total) by mouth every 6 (six) hours as needed for nausea. 06/23/21 06/23/22 Yes Willy Eddy, MD  oxyCODONE-acetaminophen (PERCOCET) 5-325 MG tablet Take 1 tablet by mouth every 4 (four) hours as needed for severe pain. 06/23/21 06/23/22 Yes Willy Eddy, MD  benzonatate (TESSALON) 100 MG capsule Take 2 capsules (200 mg total) by mouth every 8 (eight) hours. 06/21/21   Becky Augusta,  NP  ipratropium (ATROVENT) 0.06 % nasal spray Place 2 sprays into both nostrils 4 (four) times daily. 06/21/21   Becky Augusta, NP  promethazine-dextromethorphan (PROMETHAZINE-DM) 6.25-15 MG/5ML syrup Take 5 mLs by mouth 4 (four) times daily as needed. 06/21/21   Becky Augusta, NP    Allergies Patient has no known allergies.    Social History Social History   Tobacco Use   Smoking status: Former    Packs/day: 0.10    Types: Cigarettes   Smokeless tobacco: Current   Tobacco comments:    vapes enough for about 1-2 cigarettes/day  Vaping Use   Vaping Use: Every day  Substance Use Topics   Alcohol use: Yes   Drug use: No    Review of Systems Patient denies headaches, rhinorrhea, blurry vision, numbness, shortness of breath, chest pain, edema, cough, abdominal pain, nausea, vomiting, diarrhea, dysuria, fevers, rashes or hallucinations unless otherwise stated above in HPI. ____________________________________________   PHYSICAL EXAM:  VITAL SIGNS: Vitals:   06/23/21 1330 06/23/21 1345  BP: (!) 152/106   Pulse: 68 69  Resp: 18   Temp:    SpO2: 98% 98%    Constitutional: Alert and oriented.  Eyes: Conjunctivae are normal.  Head: Atraumatic. Nose: No congestion/rhinnorhea. Mouth/Throat: Mucous membranes are moist.   Neck: No stridor. Painless ROM.  Cardiovascular: Normal rate, regular rhythm. Grossly normal heart sounds.  Good peripheral circulation. Respiratory: Normal respiratory effort.  No retractions. Lungs CTAB. Gastrointestinal: Soft and nontender. No distention. No abdominal bruits. No CVA tenderness. Genitourinary:  Musculoskeletal: No lower extremity tenderness nor edema.  No joint effusions. Neurologic:  Normal speech and language. No gross focal neurologic deficits are appreciated. No facial droop Skin:  Skin is warm, dry and intact. No rash noted. Psychiatric: Mood and affect are normal. Speech and behavior are  normal.  ____________________________________________   LABS (all labs ordered are listed, but only abnormal results are displayed)  Results for orders placed or performed during the hospital encounter of 06/23/21 (from the past 24 hour(s))  Basic metabolic panel     Status: Abnormal   Collection Time: 06/23/21 11:39 AM  Result Value Ref Range   Sodium 138 135 - 145 mmol/L   Potassium 4.1 3.5 - 5.1 mmol/L   Chloride 101 98 - 111 mmol/L   CO2 25 22 - 32 mmol/L   Glucose, Bld 123 (H) 70 - 99 mg/dL   BUN 18 6 - 20 mg/dL   Creatinine, Ser 2.83 0.61 - 1.24 mg/dL   Calcium 9.2 8.9 - 15.1 mg/dL   GFR, Estimated >76 >16 mL/min   Anion gap 12 5 - 15  CBC     Status: Abnormal   Collection Time: 06/23/21 11:39 AM  Result Value Ref Range   WBC 15.9 (H) 4.0 - 10.5 K/uL   RBC 5.13 4.22 - 5.81 MIL/uL   Hemoglobin 17.3 (H) 13.0 - 17.0 g/dL   HCT 07.3 71.0 - 62.6 %   MCV 95.9 80.0 - 100.0 fL   MCH 33.7 26.0 - 34.0 pg   MCHC 35.2 30.0 - 36.0 g/dL   RDW 94.8 54.6 - 27.0 %   Platelets 328 150 - 400 K/uL   nRBC 0.0 0.0 - 0.2 %  Troponin I (High Sensitivity)     Status: None   Collection Time: 06/23/21 11:39 AM  Result Value Ref Range   Troponin I (High Sensitivity) 3 <18 ng/L  Hepatic function panel     Status: Abnormal   Collection Time: 06/23/21 11:42 AM  Result Value Ref Range   Total Protein 8.7 (H) 6.5 - 8.1 g/dL   Albumin 4.6 3.5 - 5.0 g/dL   AST 55 (H) 15 - 41 U/L   ALT 68 (H) 0 - 44 U/L   Alkaline Phosphatase 84 38 - 126 U/L   Total Bilirubin 1.8 (H) 0.3 - 1.2 mg/dL   Bilirubin, Direct 0.4 (H) 0.0 - 0.2 mg/dL   Indirect Bilirubin 1.4 (H) 0.3 - 0.9 mg/dL  Lipase, blood     Status: Abnormal   Collection Time: 06/23/21 11:42 AM  Result Value Ref Range   Lipase 229 (H) 11 - 51 U/L   ____________________________________________  EKG My review and personal interpretation at Time: 11:33   Indication: cough  Rate: 90  Rhythm: sinus Axis: normal Other: normal intervals, no  stemi ____________________________________________  RADIOLOGY  I personally reviewed all radiographic images ordered to evaluate for the above acute complaints and reviewed radiology reports and findings.  These findings were personally discussed with the patient.  Please see medical record for radiology report.  ____________________________________________   PROCEDURES  Procedure(s) performed:  Procedures    Critical Care performed: no ____________________________________________   INITIAL IMPRESSION / ASSESSMENT AND PLAN / ED COURSE  Pertinent labs & imaging results that were available during my care of the patient were reviewed by me and considered in my medical decision making (see chart for details).   DDX: URI, COVID, sinusitis, pneumonia, tickborne illness, pancreatitis, gastritis  Joshua Morton is a 25 y.o. who  presents to the ED with presentation as described above.  Patient clinically well-appearing in no acute distress.  Not consistent with ACS or cardiac etiology.  Does not seem consistent with pericarditis.  Does have nasal congestion symptoms of URI.  His abdominal exam is soft and benign.  Given duration of symptoms will cover with antibiotics has had 3 negative COVID test therefore have a lower suspicion for COVID.  Patient tolerating p.o. he is however complain of epigastric pain and given his history of pancreatitis and patient does admit to drinking alcohol 3 days ago may be having symptoms of pancreatitis.  Do not feel that repeat CT imaging is clinically indicated at this time.  Will add on lipase as well as LFTs.  Clinical Course as of 06/23/21 1438  Sat Jun 23, 2021  1343 Patient's lipase is elevated.  States he last drink 2 days ago.  I suspect most of his symptoms are related to pancreatitis.  Will give IV fluids as well as IV pain medication.  I suspect the patient will be appropriate for outpatient follow-up. [PR]  1434 Patient feeling improved.   Stable and appropriate for outpatient follow-up. [PR]    Clinical Course User Index [PR] Willy Eddy, MD    The patient was evaluated in Emergency Department today for the symptoms described in the history of present illness. He/she was evaluated in the context of the global COVID-19 pandemic, which necessitated consideration that the patient might be at risk for infection with the SARS-CoV-2 virus that causes COVID-19. Institutional protocols and algorithms that pertain to the evaluation of patients at risk for COVID-19 are in a state of rapid change based on information released by regulatory bodies including the CDC and federal and state organizations. These policies and algorithms were followed during the patient's care in the ED.  As part of my medical decision making, I reviewed the following data within the electronic MEDICAL RECORD NUMBER Nursing notes reviewed and incorporated, Labs reviewed, notes from prior ED visits and [place] Controlled Substance Database   ____________________________________________   FINAL CLINICAL IMPRESSION(S) / ED DIAGNOSES  Final diagnoses:  Nausea and vomiting, intractability of vomiting not specified, unspecified vomiting type  Alcohol-induced acute pancreatitis, unspecified complication status      NEW MEDICATIONS STARTED DURING THIS VISIT:  New Prescriptions   METOCLOPRAMIDE (REGLAN) 10 MG TABLET    Take 1 tablet (10 mg total) by mouth every 6 (six) hours as needed for nausea.   OXYCODONE-ACETAMINOPHEN (PERCOCET) 5-325 MG TABLET    Take 1 tablet by mouth every 4 (four) hours as needed for severe pain.     Note:  This document was prepared using Dragon voice recognition software and may include unintentional dictation errors.    Willy Eddy, MD 06/23/21 1438

## 2021-06-23 NOTE — ED Notes (Signed)
Patient states he drinks 5-6 drinks of liquor daily mixed with cola. Patient states he hasn't drank any liquor yesterday or today.

## 2021-10-01 ENCOUNTER — Other Ambulatory Visit: Payer: Self-pay

## 2021-10-01 ENCOUNTER — Encounter: Payer: Self-pay | Admitting: Emergency Medicine

## 2021-10-01 ENCOUNTER — Ambulatory Visit
Admission: EM | Admit: 2021-10-01 | Discharge: 2021-10-01 | Disposition: A | Discharge status: Home / Self Care | Payer: BC Managed Care – PPO | Attending: Emergency Medicine | Admitting: Emergency Medicine

## 2021-10-01 DIAGNOSIS — S39012A Strain of muscle, fascia and tendon of lower back, initial encounter: Secondary | ICD-10-CM

## 2021-10-01 DIAGNOSIS — S29019A Strain of muscle and tendon of unspecified wall of thorax, initial encounter: Secondary | ICD-10-CM

## 2021-10-01 MED ORDER — PREDNISONE 10 MG (21) PO TBPK: ORAL_TABLET | ORAL | 0 refills | Status: DC

## 2021-10-01 MED ORDER — BACLOFEN 10 MG PO TABS: 10.0000 mg | ORAL_TABLET | Freq: Three times a day (TID) | ORAL | 0 refills | Status: DC

## 2021-10-01 NOTE — ED Provider Notes (Signed)
[facility]    CSN: [id number] Arrival date & time: 10/01/21  1109      History   Chief Complaint Chief Complaint  Patient presents with   Back Pain    Right lower    HPI Joshua Morton is a 25 y.o. male.   HPI  45 old male here for evaluation of increased  Patient reports that for last 4 to 5 days he has been experiencing pain in his low and mid back that is worse on the right.  He states that after he has been immobile for a while and he goes to move the pain will intensify and will also wrap around the right side of his rib cage.  He states this started while he was swinging a sledgehammer at work.  He denies any numbness or tingling in any of his extremities.  He has been using over-the-counter ibuprofen without any significant improvement of his pain as well as using a heating pad.  Patient does not member any precipitating incident or injury that caused the pain he is experiencing currently.  Past Medical History:  Diagnosis Date   Acute pancreatitis 09/04/2019   Acute respiratory failure with hypoxia (HCC)    Attempted suicide (HCC)    GERD (gastroesophageal reflux disease) 09/04/2019   Pancreatitis, alcoholic, acute 09/04/2019    Patient Active Problem List   Diagnosis Date Noted   Alcoholic pancreatitis 03/15/2021   Essential hypertension    Tachycardia    Abdominal pain    Fatty liver 09/04/2019   Alcohol abuse 09/04/2019   GERD (gastroesophageal reflux disease) 09/04/2019    History reviewed. No pertinent surgical history.     Home Medications    Prior to Admission medications   Medication Sig Start Date End Date Taking? Authorizing Provider  baclofen (LIORESAL) 10 MG tablet Take 1 tablet (10 mg total) by mouth 3 (three) times daily. 10/01/21  Yes Becky Augusta, NP  predniSONE (STERAPRED UNI-PAK 21 TAB) 10 MG (21) TBPK tablet Take 6 tablets on day 1, 5 tablets day 2, 4 tablets day 3, 3 tablets day 4, 2 tablets day 5, 1 tablet day  6 10/01/21  Yes Becky Augusta, NP  benzonatate (TESSALON) 100 MG capsule Take 2 capsules (200 mg total) by mouth every 8 (eight) hours. 06/21/21   Becky Augusta, NP  ipratropium (ATROVENT) 0.06 % nasal spray Place 2 sprays into both nostrils 4 (four) times daily. 06/21/21   Becky Augusta, NP  metoCLOPramide (REGLAN) 10 MG tablet Take 1 tablet (10 mg total) by mouth every 6 (six) hours as needed for nausea. 06/23/21 06/23/22  Willy Eddy, MD  oxyCODONE-acetaminophen (PERCOCET) 5-325 MG tablet Take 1 tablet by mouth every 4 (four) hours as needed for severe pain. 06/23/21 06/23/22  Willy Eddy, MD  promethazine-dextromethorphan (PROMETHAZINE-DM) 6.25-15 MG/5ML syrup Take 5 mLs by mouth 4 (four) times daily as needed. 06/21/21   Becky Augusta, NP    Family History Family History  Problem Relation Age of Onset   Hypertension Mother     Social History Social History   Tobacco Use   Smoking status: Former    Packs/day: 0.10    Types: Cigarettes   Smokeless tobacco: Current   Tobacco comments:    vapes enough for about 1-2 cigarettes/day  Vaping Use   Vaping Use: Every day  Substance Use Topics   Alcohol use: Yes    Comment: occ   Drug use: Yes    Types: Marijuana  Allergies   Patient has no known allergies.   Review of Systems Review of Systems  Constitutional:  Negative for activity change, appetite change and fever.  Musculoskeletal:  Positive for back pain.  Neurological:  Negative for weakness and numbness.  Hematological: Negative.   Psychiatric/Behavioral: Negative.      Physical Exam Triage Vital Signs ED Triage Vitals [10/01/21 1305]  Enc Vitals Group     BP (!) 147/96     Pulse Rate 95     Resp 18     Temp 98.5 F (36.9 C)     Temp Source Oral     SpO2 98 %     Weight      Height      Head Circumference      Peak Flow      Pain Score 6     Pain Loc      Pain Edu?      Excl. in GC?    No data found.  Updated Vital Signs BP (!) 147/96 (BP Location:  Right Arm)   Pulse 95   Temp 98.5 F (36.9 C) (Oral)   Resp 18   SpO2 98%   Visual Acuity Right Eye Distance:   Left Eye Distance:   Bilateral Distance:    Right Eye Near:   Left Eye Near:    Bilateral Near:     Physical Exam Vitals and nursing note reviewed.  Constitutional:      General: He is not in acute distress.    Appearance: Normal appearance. He is not ill-appearing.  HENT:     Head: Normocephalic and atraumatic.  Cardiovascular:     Rate and Rhythm: Normal rate and regular rhythm.     Pulses: Normal pulses.     Heart sounds: Normal heart sounds. No murmur heard.   No gallop.  Pulmonary:     Effort: Pulmonary effort is normal.     Breath sounds: Normal breath sounds. No wheezing, rhonchi or rales.  Musculoskeletal:        General: Tenderness present. No swelling or deformity. Normal range of motion.  Skin:    General: Skin is warm and dry.     Capillary Refill: Capillary refill takes less than 2 seconds.     Findings: No bruising or erythema.  Neurological:     General: No focal deficit present.     Mental Status: He is alert.     Sensory: No sensory deficit.     Motor: No weakness.  Psychiatric:        Mood and Affect: Mood normal.        Behavior: Behavior normal.        Thought Content: Thought content normal.        Judgment: Judgment normal.     UC Treatments / Results  Labs (all labs ordered are listed, but only abnormal results are displayed) Labs Reviewed - No data to display  EKG   Radiology No results found.  Procedures Procedures (including critical care time)  Medications Ordered in UC Medications - No data to display  Initial Impression / Assessment and Plan / UC Course  I have reviewed the triage vital signs and the nursing notes.  Pertinent labs & imaging results that were available during my care of the patient were reviewed by me and considered in my medical decision making (see chart for details).  Patient is a  nontoxic-appearing 25 year old male here for evaluation of thoracic and lumbar back pain that  has been going on for the past 4 to 5 days.  The pain began after swinging a sledgehammer at work.  He has been using over-the-counter NSAIDs and a dry heating pad without any resolution of symptoms.  Patient's physical exam reveals a normal axial carriage.  Cardiopulmonary exam feels clung sounds all fields.  There is no midline spinous tenderness in either the thoracic or lumbar region.  There is moderate thoracic and lumbar paraspinous process which is greater on the right than the left.  Patient's bilateral upper and lower extremity strength is 5/5.  Patient's exam is consistent with lumbar and thoracic strain, most likely secondary to swinging a sledgehammer.  Patient reports that he does not do that type of work very often.  I have advised him to stop taking the ibuprofen and we will start him on a steroid Dosepak tomorrow morning.  I have also prescribed baclofen to help with the muscle spasm.  I have encouraged him to continue to utilize moist heat and have also given him home physical therapy exercises to perform.  Work note for today provided.   Final Clinical Impressions(s) / UC Diagnoses   Final diagnoses:  Strain of lumbar region, initial encounter  Thoracic myofascial strain, initial encounter     Discharge Instructions      Take the Prednisone starting tomorrow morning and take each morning with breakfast.   Take the baclofen, 10 mg every 8 hours, on a schedule for the next 48 hours and then as needed.  Apply moist heat to your back for 30 minutes at a time 2-3 times a day to improve blood flow to the area and help remove the lactic acid causing the spasm.  Follow the back exercises given at discharge.  Return for reevaluation for any new or worsening symptoms.      ED Prescriptions     Medication Sig Dispense Auth. Provider   predniSONE (STERAPRED UNI-PAK 21 TAB) 10 MG (21) TBPK  tablet Take 6 tablets on day 1, 5 tablets day 2, 4 tablets day 3, 3 tablets day 4, 2 tablets day 5, 1 tablet day 6 21 tablet Becky Augusta, NP   baclofen (LIORESAL) 10 MG tablet Take 1 tablet (10 mg total) by mouth 3 (three) times daily. 30 each Becky Augusta, NP      PDMP not reviewed this encounter.   Becky Augusta, NP 10/01/21 1505

## 2021-10-01 NOTE — Discharge Instructions (Signed)
Take the Prednisone starting tomorrow morning and take each morning with breakfast.   Take the baclofen, 10 mg every 8 hours, on a schedule for the next 48 hours and then as needed.  Apply moist heat to your back for 30 minutes at a time 2-3 times a day to improve blood flow to the area and help remove the lactic acid causing the spasm.  Follow the back exercises given at discharge.  Return for reevaluation for any new or worsening symptoms.

## 2021-10-01 NOTE — ED Triage Notes (Signed)
Pt presents today with c/o of right lower back pain that radiates to right front x 4-5 days. Denies injury.

## 2022-01-02 DIAGNOSIS — K863 Pseudocyst of pancreas: Secondary | ICD-10-CM | POA: Insufficient documentation

## 2022-01-19 ENCOUNTER — Emergency Department: Payer: BC Managed Care – PPO

## 2022-01-19 ENCOUNTER — Other Ambulatory Visit: Payer: Self-pay

## 2022-01-19 ENCOUNTER — Emergency Department
Admission: EM | Admit: 2022-01-19 | Discharge: 2022-01-19 | Disposition: A | Discharge status: Home / Self Care | Payer: BC Managed Care – PPO | Attending: Emergency Medicine | Admitting: Emergency Medicine

## 2022-01-19 ENCOUNTER — Encounter: Payer: Self-pay | Admitting: Emergency Medicine

## 2022-01-19 DIAGNOSIS — K9429 Other complications of gastrostomy: Secondary | ICD-10-CM | POA: Diagnosis present

## 2022-01-19 DIAGNOSIS — K9420 Gastrostomy complication, unspecified: Secondary | ICD-10-CM

## 2022-01-19 LAB — CBC
HCT: 41.1 % (ref 39.0–52.0)
Hemoglobin: 13.4 g/dL (ref 13.0–17.0)
MCH: 28.9 pg (ref 26.0–34.0)
MCHC: 32.6 g/dL (ref 30.0–36.0)
MCV: 88.6 fL (ref 80.0–100.0)
Platelets: 227 10*3/uL (ref 150–400)
RBC: 4.64 MIL/uL (ref 4.22–5.81)
RDW: 13.2 % (ref 11.5–15.5)
WBC: 5.5 10*3/uL (ref 4.0–10.5)
nRBC: 0 % (ref 0.0–0.2)

## 2022-01-19 LAB — COMPREHENSIVE METABOLIC PANEL
ALT: 25 U/L (ref 0–44)
AST: 29 U/L (ref 15–41)
Albumin: 4.2 g/dL (ref 3.5–5.0)
Alkaline Phosphatase: 70 U/L (ref 38–126)
Anion gap: 7 (ref 5–15)
BUN: 21 mg/dL — ABNORMAL HIGH (ref 6–20)
CO2: 30 mmol/L (ref 22–32)
Calcium: 9.6 mg/dL (ref 8.9–10.3)
Chloride: 100 mmol/L (ref 98–111)
Creatinine, Ser: 0.86 mg/dL (ref 0.61–1.24)
GFR, Estimated: >60 mL/min (ref >60)
Glucose, Bld: 102 mg/dL — ABNORMAL HIGH (ref 70–99)
Potassium: 4.7 mmol/L (ref 3.5–5.1)
Sodium: 137 mmol/L (ref 135–145)
Total Bilirubin: 0.9 mg/dL (ref 0.3–1.2)
Total Protein: 8.2 g/dL — ABNORMAL HIGH (ref 6.5–8.1)

## 2022-01-19 LAB — LIPASE, BLOOD: Lipase: 26 U/L (ref 11–51)

## 2022-01-19 MED ORDER — MORPHINE SULFATE (PF) 4 MG/ML IV SOLN
4.0000 mg | Freq: Once | INTRAVENOUS | Status: AC
  Administered 2022-01-19: 4 mg via INTRAVENOUS
  Filled 2022-01-19: qty 1

## 2022-01-19 MED ORDER — ONDANSETRON HCL 4 MG/2ML IJ SOLN
4.0000 mg | INTRAMUSCULAR | Status: AC
  Administered 2022-01-19: 4 mg via INTRAVENOUS
  Filled 2022-01-19: qty 2

## 2022-01-19 MED ORDER — SODIUM CHLORIDE 0.9 % IV BOLUS
1000.0000 mL | Freq: Once | INTRAVENOUS | Status: AC
  Administered 2022-01-19: 1000 mL via INTRAVENOUS

## 2022-01-19 NOTE — ED Provider Notes (Signed)
? ?[facility] ?Provider Note ? ? ? Event Date/Time  ? First MD Initiated Contact with Patient 01/19/22 1020   ?  (approximate) ? ? ?History  ? ?Feeding tube feels stuck in throat ? ?HPI ? ?Joshua Morton is a 26 y.o. male with an extensive history of pancreatitis and alcoholic pancreatitis.  He was discharged I reviewed his discharge summary from March 29 of this year.  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  He left the hospital is doing well, he was able to do his tube feed last night without difficulty. ? ?He was out shopping at [facility] today when suddenly he felt as though something was in the back of his throat made him feel very nauseated, feel like he needed to gag, and vomited once and he feels as though a piece of his feeding tube was in the back of his throat ?  ?He does not report that he is having abdominal pain.  Reports he felt like something in the back of his throat very irritating nauseated and vomit.  Reports he stuck his hand into his mouth and felt what felt like the tip of his feeding tube in there. ? ?Currently reports feels improved, no difficulty breathing but when it happened he felt it made like his vocal cords want to spasm ? ?Physical Exam  ? ?Triage Vital Signs: ?ED Triage Vitals  ?Enc Vitals Group  ?   BP 01/19/22 1018 (!) 144/87  ?   Pulse Rate 01/19/22 1018 (!) 109  ?   Resp 01/19/22 1018 20  ?   Temp 01/19/22 1018 97.8 ?F (36.6 ?C)  ?   Temp Source 01/19/22 1018 Axillary  ?   SpO2 01/19/22 1018 100 %  ?   Weight 01/19/22 1017 161 lb (73 kg)  ?   Height 01/19/22 1017 6' (1.829 m)  ?   Head Circumference --   ?   Peak Flow --   ?   Pain Score --   ?   Pain Loc --   ?   Pain Edu? --   ?   Excl. in GC? --   ? ? ?Most recent vital signs: ?Vitals:  ? 01/19/22 1230 01/19/22 1330  ?BP: 106/69 113/67  ?Pulse: (!) 57 62  ?Resp: 17 17  ?Temp:    ?SpO2: 97% 100%   ? ? ? ?General: Awake, no distress.  Pleasant. ?Examined oropharynx posterior oropharynx, able to see the superior aspect of the epiglottis which appears normal.  No noted foreign body or feeding tube at this time denoted.  No obvious foreign body in the airway ?CV:  Good peripheral perfusion.  Normal heart tone ?Resp:  Normal effort.  Clear lung sounds bilaterally ?Abd:  No distention.  Soft nontender nondistended.  Feeding tube site in the left upper abdomen clean dry intact and nontender surround.  External feeding tube hardware appears to be normal ?Other:  Ambulatory without distress ? ? ?ED Results / Procedures / Treatments  ? ?Labs ?(all labs ordered are listed, but only abnormal results are displayed) ?Labs Reviewed  ?COMPREHENSIVE METABOLIC PANEL - Abnormal; Notable for the following components:  ?    Result Value  ? Glucose, Bld 102 (*)   ? BUN 21 (*)   ? Total Protein 8.2 (*)   ? All other components within normal limits  ?CBC  ?LIPASE,  BLOOD  ? ? ? ?EKG ? ? ? ? ?RADIOLOGY ? ?Personally viewed and interpreted the patient's chest x-ray, concerning as it appears that a portion of his feeding tube is superior into the esophagus ? ?PROCEDURES: ? ?Critical Care performed: No ? ?Procedures ? ? ?MEDICATIONS ORDERED IN ED: ?Medications  ?sodium chloride 0.9 % bolus 1,000 mL (0 mLs Intravenous Stopped 01/19/22 1353)  ?morphine (PF) 4 MG/ML injection 4 mg (4 mg Intravenous Given 01/19/22 1158)  ?ondansetron (ZOFRAN) injection 4 mg (4 mg Intravenous Given 01/19/22 1158)  ?morphine (PF) 4 MG/ML injection 4 mg (4 mg Intravenous Given 01/19/22 1414)  ? ? ? ?IMPRESSION / MDM / ASSESSMENT AND PLAN / ED COURSE  ?I reviewed the triage vital signs and the nursing notes. ?             ?               ? ?Differential diagnosis includes, but is not limited to, possible feeding tube malfunction, recurrent pancreatitis, gastric obstruction, etc. ? ?The patient is on the cardiac monitor to evaluate for evidence of arrhythmia and/or  significant heart rate changes. ? ?Labs reviewed notable for normal CBC. ? ?Clinical Course as of 01/19/22 1414  ?Sat Jan 19, 2022  ?1132 Jejunostomy tube terminates within the distal esophagus. This will ?require revision such that the jejunostomy tube terminates in the ?jejunum. [MQ]  ?1132 Discussed with our radiology team, radiology tech he is power sharing his x-rays today to [facility] hospitals [MQ]  ?1133 Discussed with patient and his fianc?e, both understanding agreeable with transfer to [MQ]  ?1133 requesting a transfer back to [facility] that I have Dr. Norma Fredrickson our GI physician, and having reviewed his case clinical history and concern for feeding tube malposition he advises that this needs to be referred back to [facility], Dr. Norma Fredrickson does not manage feeding tubes of this nature consulted with [MQ]  ?1133 As we do not have appropriate gastrointestinal services in consult available today to manage complication of the feeding tube at this nature, patient will require transfer.  Requesting transfer to [facility] hospitals at this time [MQ]  ?1156 Spoke with [facility] transfer center, and also hospitalist Dr. Tresa Garter.  [facility] advises they cannot accept due to at capacity.  Additionally, they noted that the patient actually was seen and cared for at [facility] in [place].  Now calling [facility] to request transfer back [facility].  Updated patient and his fianc?e on this, they are understanding and would also be agreeable to transfer to [facility] [MQ]  ?  ?Clinical Course User Index ?[MQ] Sharyn Creamer, MD  ? ?----------------------------------------- ?2:15 PM on 01/19/2022 ?----------------------------------------- ?Patient resting at this time, no acute distress or extremis.  No respiratory distress.  Medicated with additional morphine as he reports that it is causing a very irritated feeling in his lower chest. ? ?He is stable for ALS level transfer at this time to [facility].  I discussed this case and  he has been accepted by Dr. Pilar Plate ? ?FINAL CLINICAL IMPRESSION(S) / ED DIAGNOSES  ? ?Final diagnoses:  ?Complication of feeding tube (HCC)  ? ? ? ?Rx / DC Orders  ? ?ED Discharge Orders   ? ? None  ? ?  ? ? ? ?Note:  This document was prepared using Dragon voice recognition software and may include unintentional dictation errors. ?  Sharyn Creamer, MD ?01/19/22 1420 ? ?

## 2022-01-19 NOTE — ED Notes (Signed)
[facility] transfer Center called spoke to [place] ?

## 2022-01-19 NOTE — ED Triage Notes (Signed)
Pt via POV from home. Per family, pt "threw up his G-tube." Family states that he got inserted last week for pancreatitis. G-tube intact at the site on his stomach. Pt is A&OX4 and NAD.  ?

## 2022-01-19 NOTE — ED Notes (Signed)
EMS here to transport pt, report given, no other questions needed from this IV, NAD noted. VSS.  ?

## 2022-01-19 NOTE — ED Notes (Signed)
Spoke with Rosey Bath at [facility] for transfer ?

## 2022-01-19 NOTE — ED Notes (Signed)
X RAY at bedside 

## 2022-01-19 NOTE — ED Notes (Addendum)
Report to [facility] at [facility].  ? ?Report to [place] at [facility] dispatch.  ? ?Delsa Sale will call back when truck is on the road and will provide a new ETA for transport.  ? ?Pt going to [place] [address] ?

## 2022-01-19 NOTE — ED Notes (Signed)
Pt presents to ED with c/o of vomiting and concerns of "throwing up his feeding tube". Pt states this tube was placed due to a cyst on his pancreas. Pt states when he vomited he felt something come up his throat ad states he feels as if he pulled some type of tube. Pt denies any issues with swallowing or breathing at this time. ? ?Tube present is sightly different than a normal feeding tube, tube is also present outside of the body, pt not able to tell this RN is if it has moved in length or is shorter than normal on exterior of body.  ?

## 2022-01-19 NOTE — ED Notes (Signed)
Pt updated on plan of care and current status.  ?

## 2022-01-19 NOTE — ED Notes (Signed)
Signature pad not working. Verbal consent for transfer witnessed by Beecher Mcardle, Charity fundraiser.  ?

## 2022-01-20 DIAGNOSIS — F1210 Cannabis abuse, uncomplicated: Secondary | ICD-10-CM | POA: Insufficient documentation

## 2022-09-08 IMAGING — DX DG CHEST 1V PORT
1 series · 2 of 2 positions shown · non-contrast
Comparison: None.

CLINICAL DATA: Patient with G-tube. Feeling of tube extending in to
his esophagus following vomiting.

EXAM:
PORTABLE CHEST 1 VIEW

[Series 1: chest ap · 0.14mm/px · 2 of 2 slices shown]
[im 1/2]
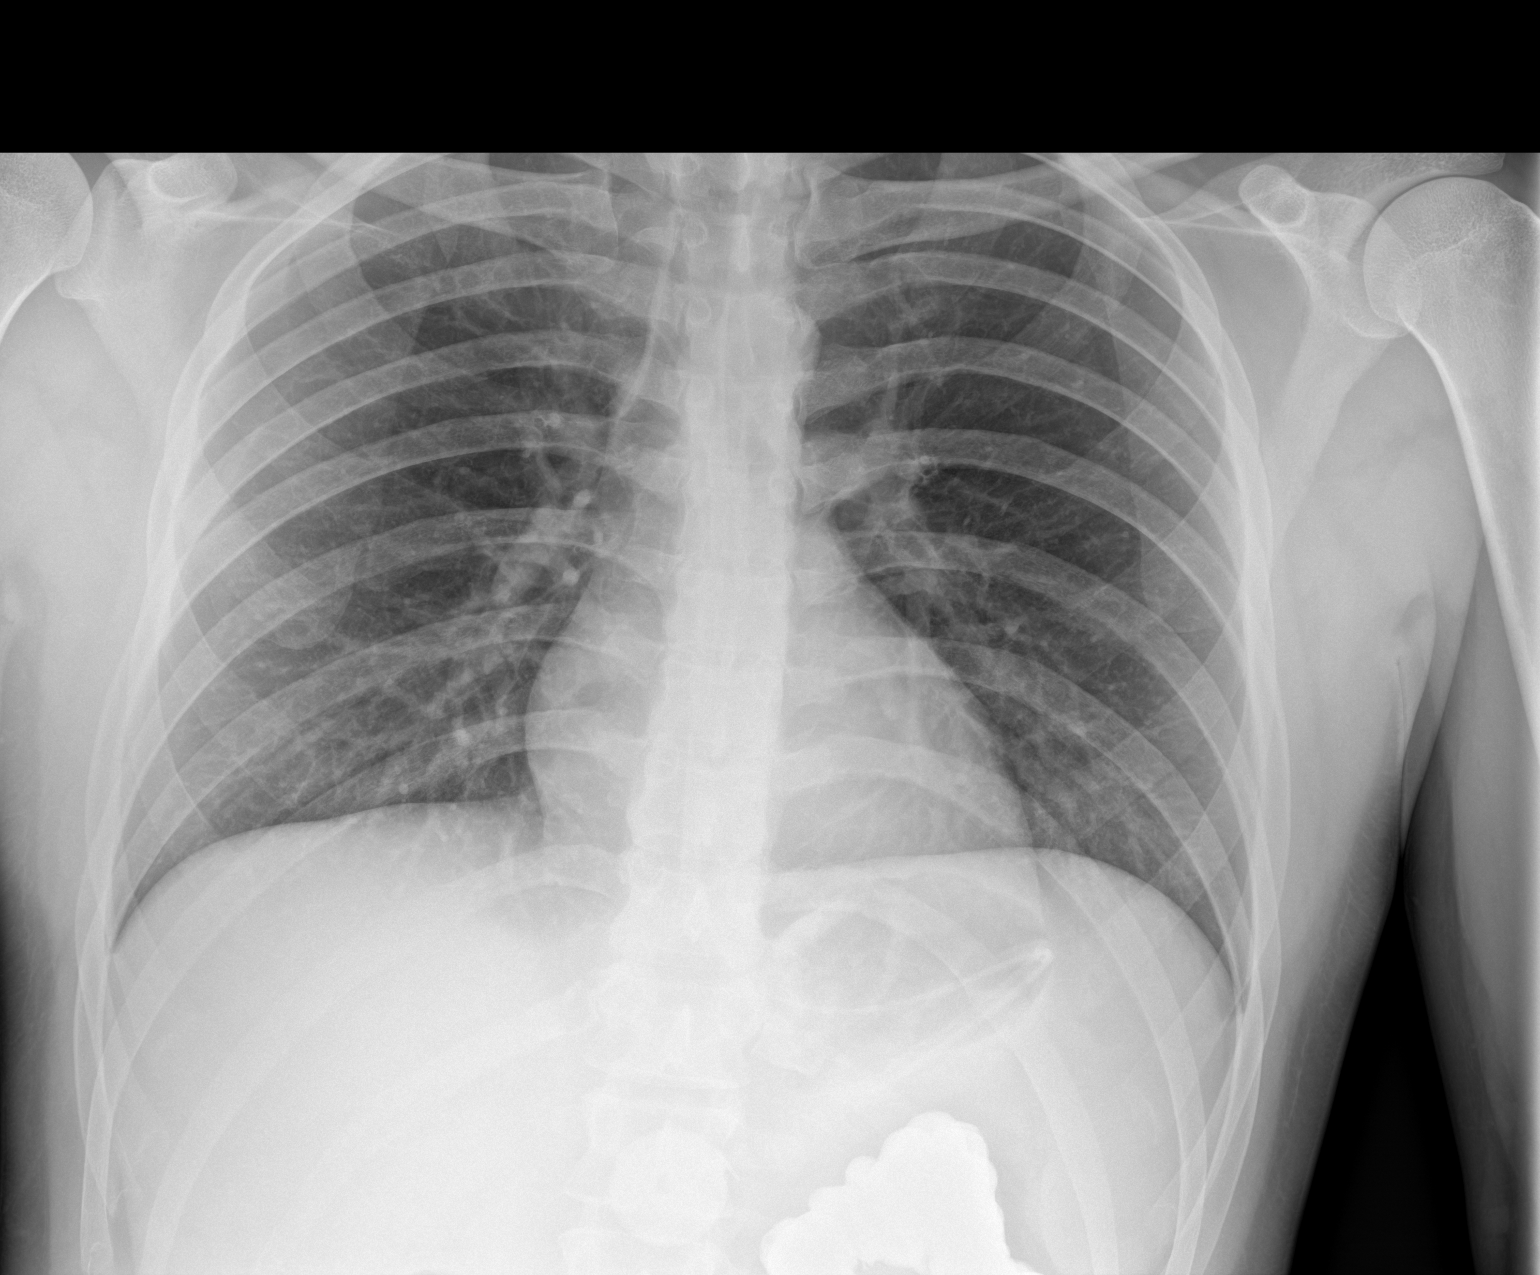
[im 2/2]
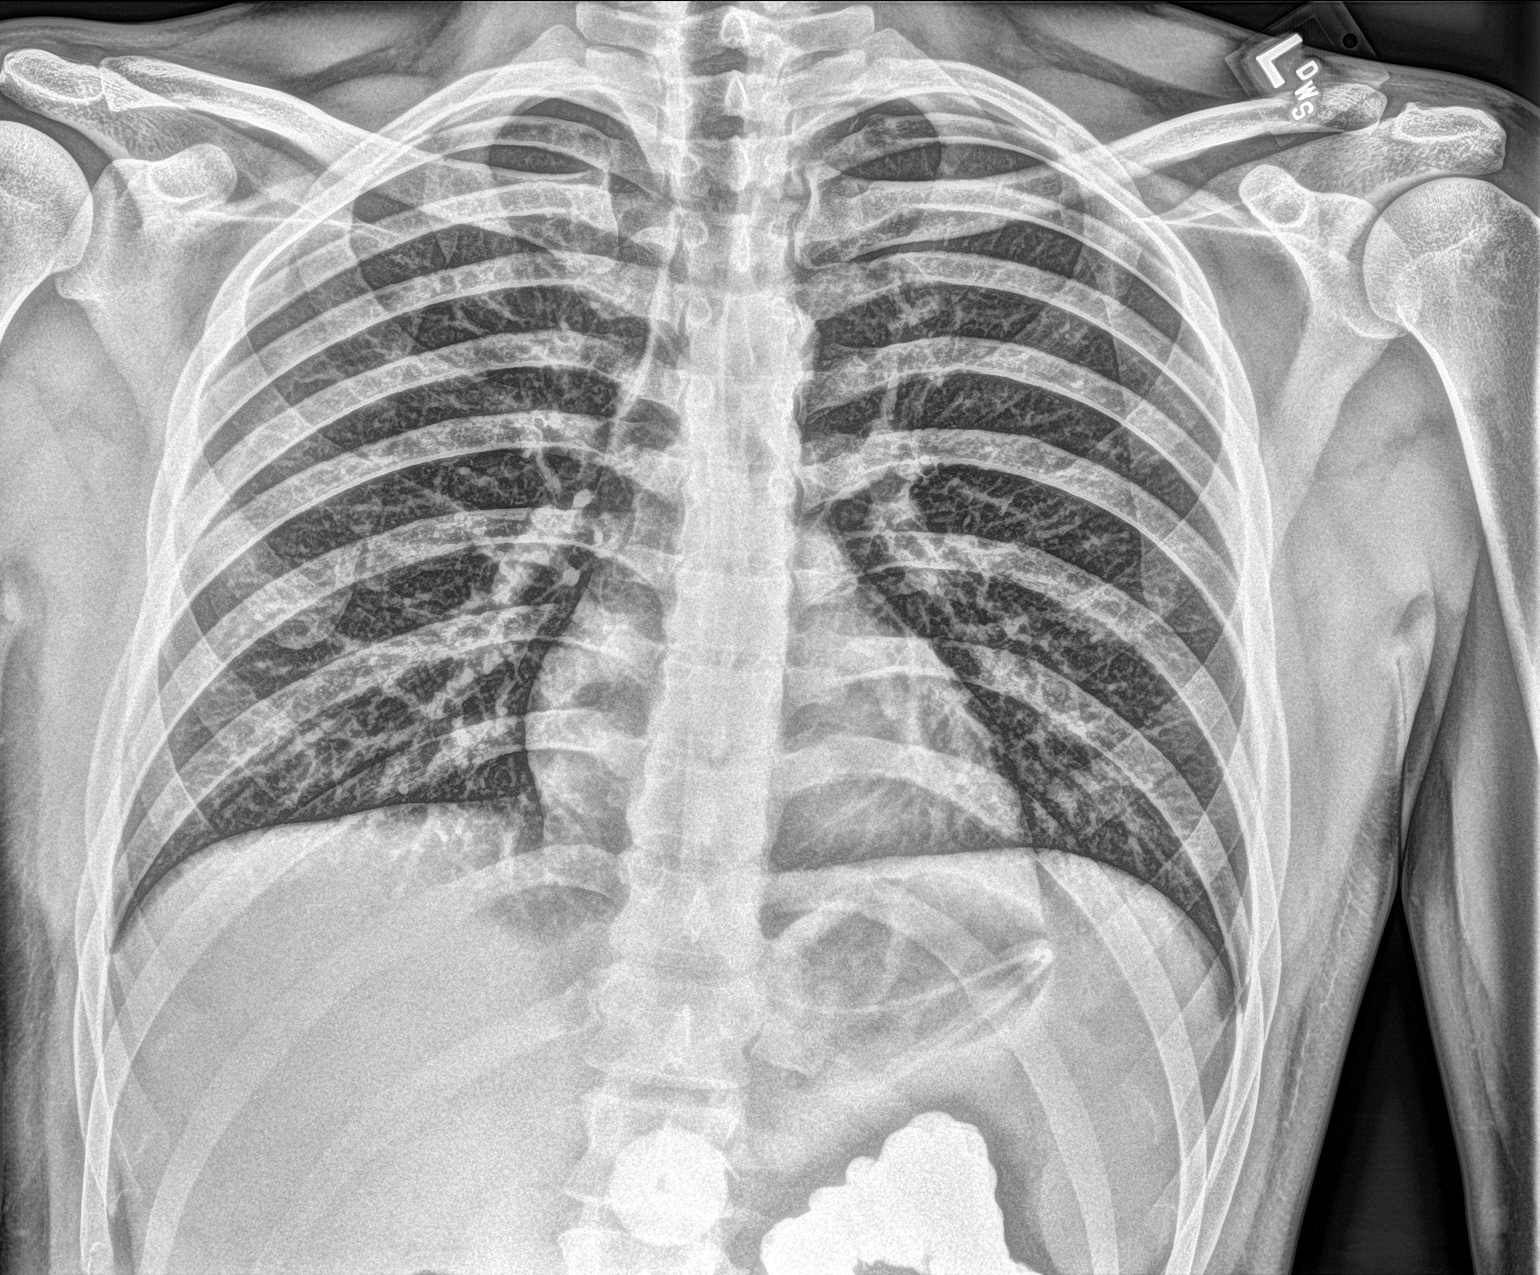

[2 of 2 positions shown; findings below may reference images not displayed]

FINDINGS: The cardiomediastinal silhouette is unremarkable.

There is no evidence of focal airspace disease, pulmonary edema,
suspicious pulmonary nodule/mass, pleural effusion, or pneumothorax.

No acute bony abnormalities are identified.

The patient's percutaneous gastrostomy tube extends cephalad into
the distal/mid esophagus with tip difficult to visualize.
IMPRESSION: 1. No active cardiopulmonary disease.
2. Percutaneous gastrostomy tube extends cephalad into the
distal/mid esophagus, with tip difficult to visualize on this study.

## 2022-09-08 IMAGING — DX DG ABD PORTABLE 1V
2 series · 4 of 4 positions shown · non-contrast
Comparison: 11/29/2021.

CLINICAL DATA: Evaluate gastrostomy tube

EXAM:
PORTABLE ABDOMEN - 1 VIEW

[Series 1: abdomen supine · 0.14mm/px · 2 of 2 slices shown (1 of 2)]
[im 1/2]
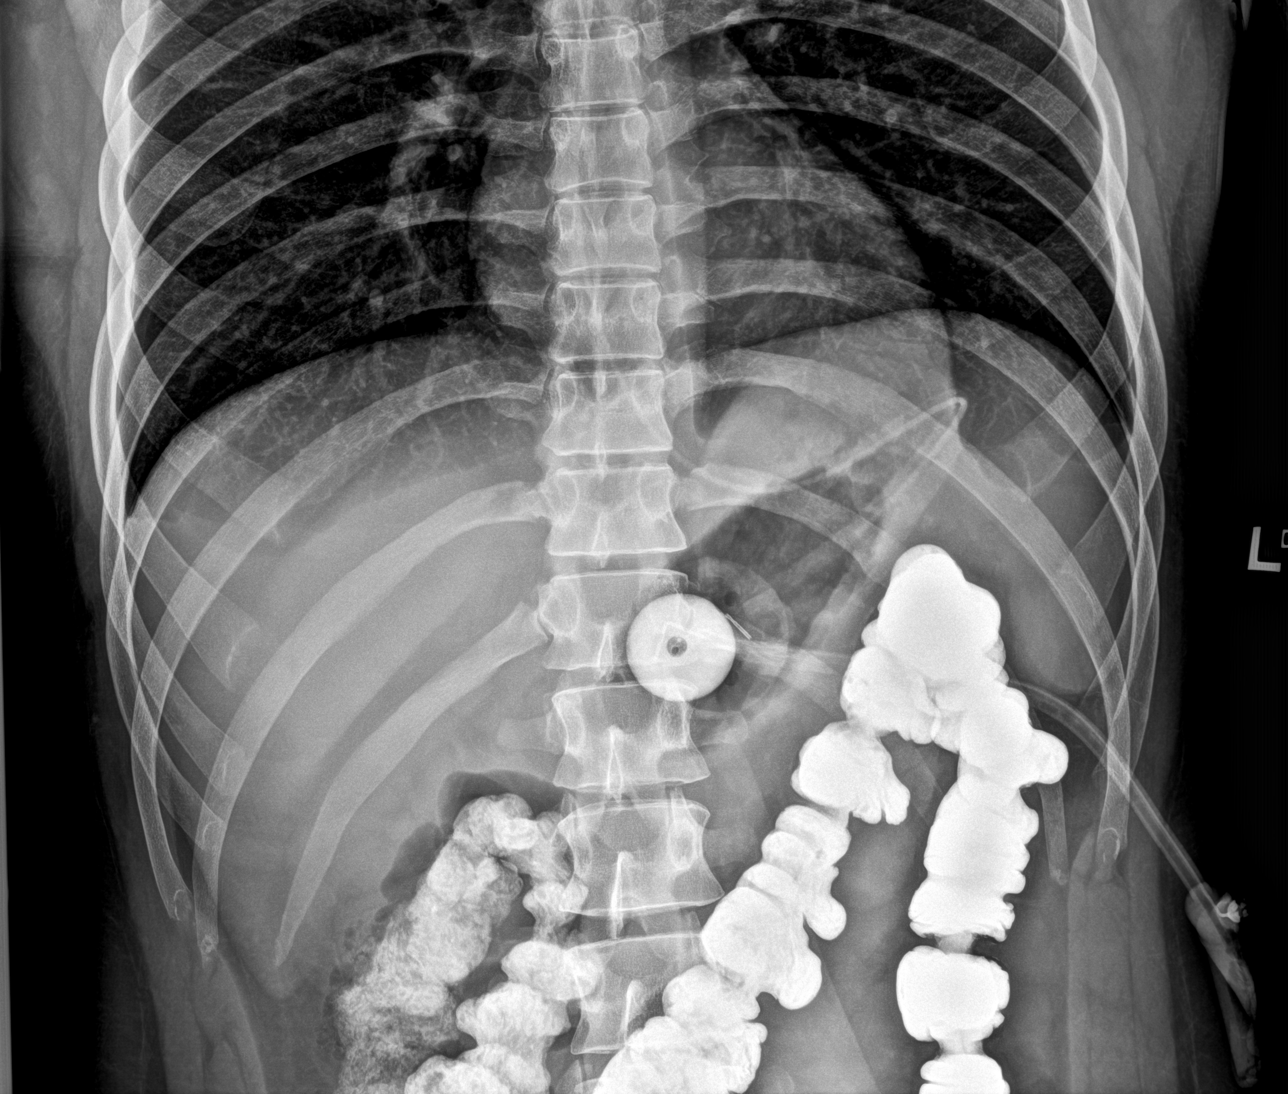
[im 2/2]
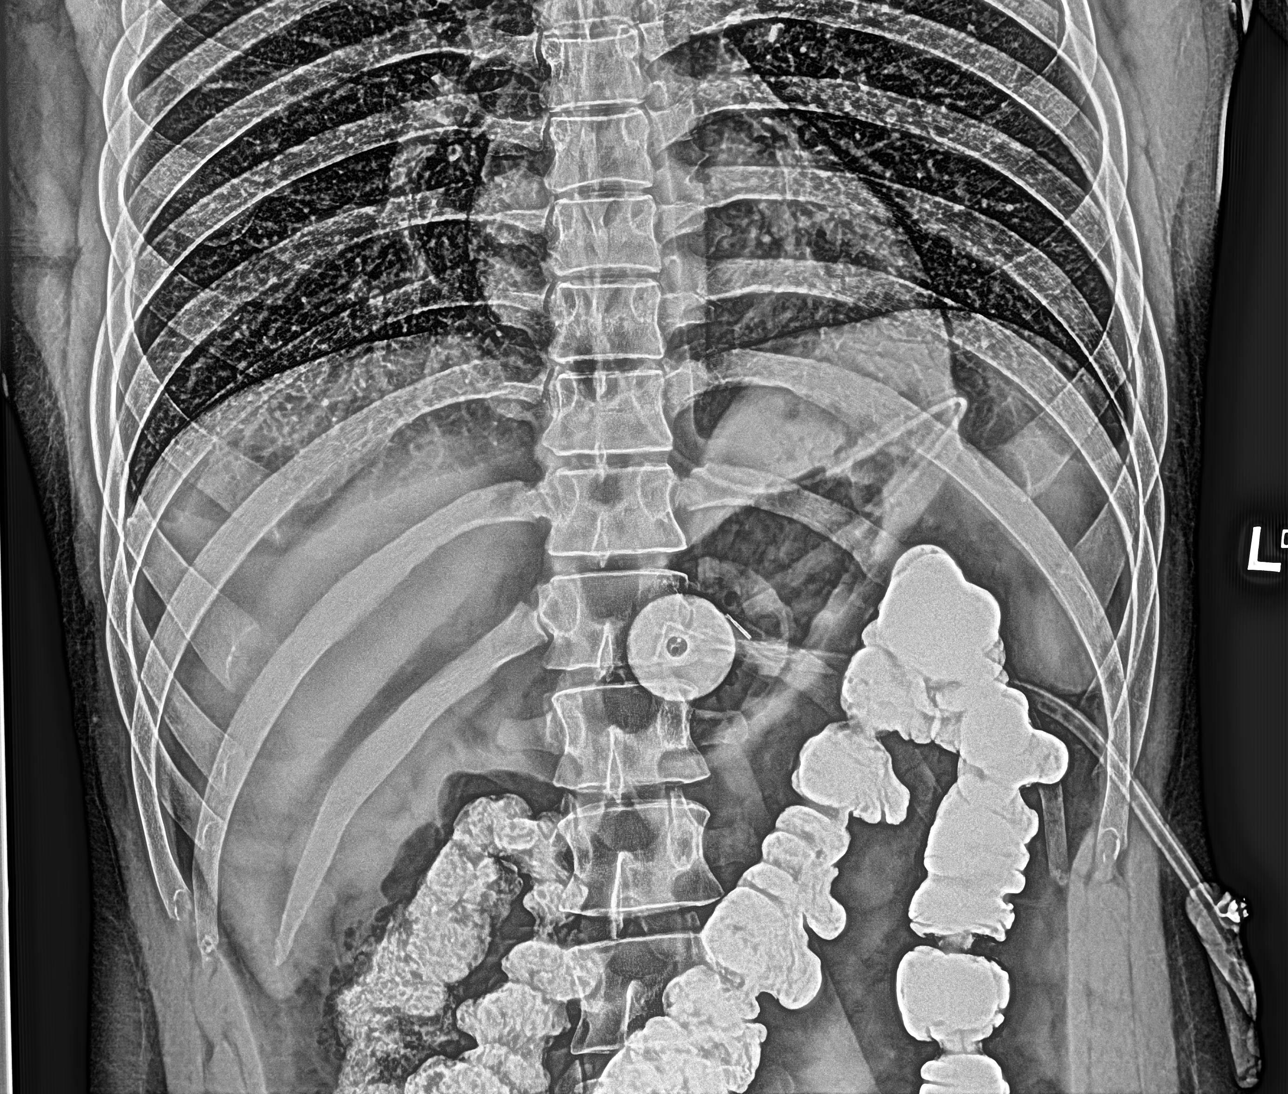

[Series 2: abdomen supine · 0.14mm/px · 2 of 2 slices shown (2 of 2)]
[im 1/2]
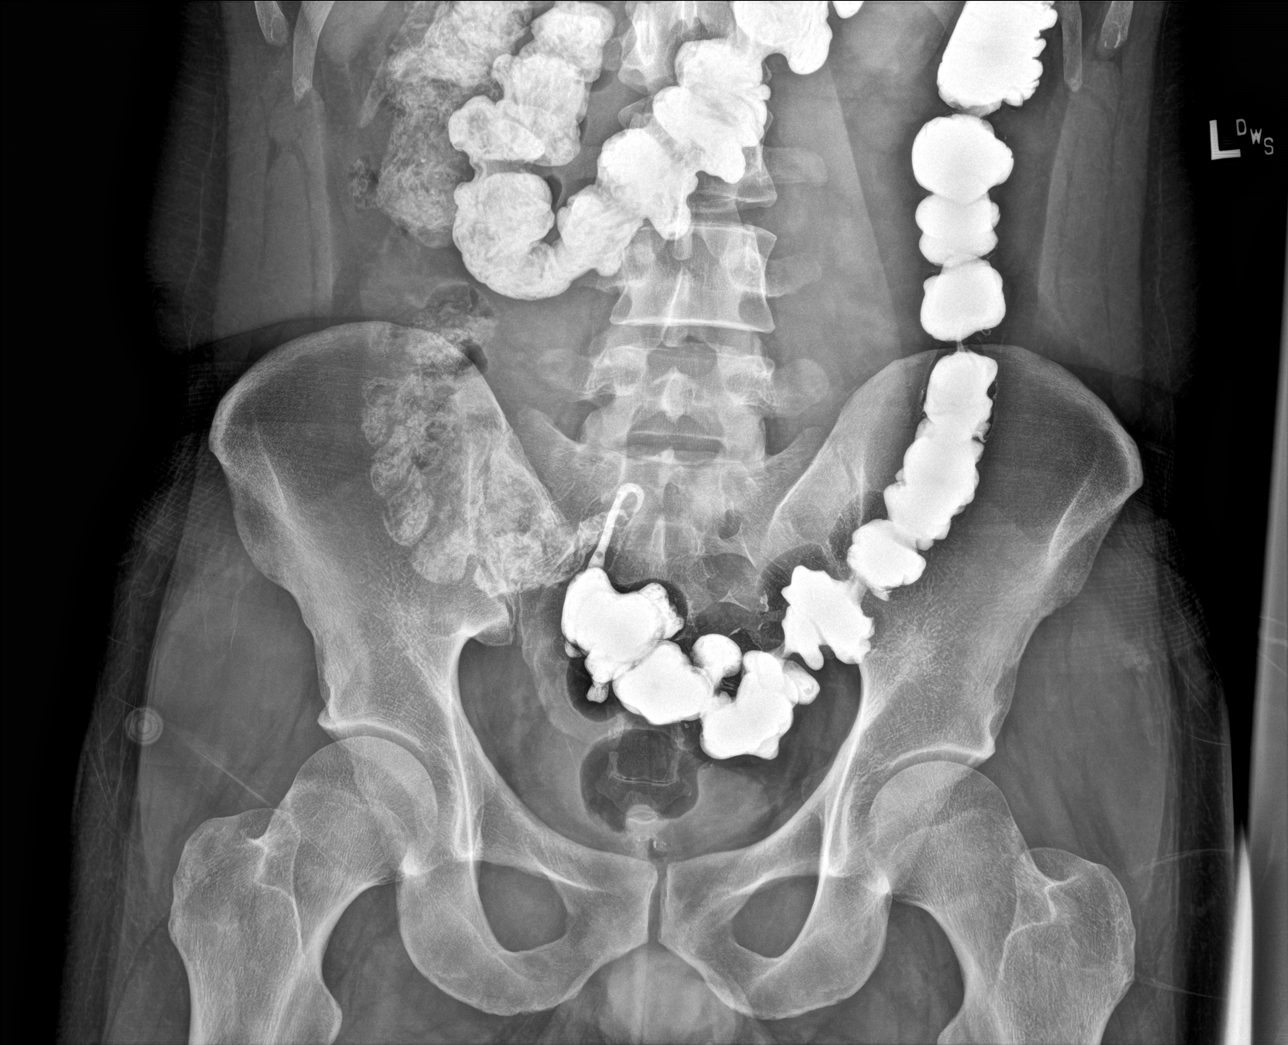
[im 2/2]
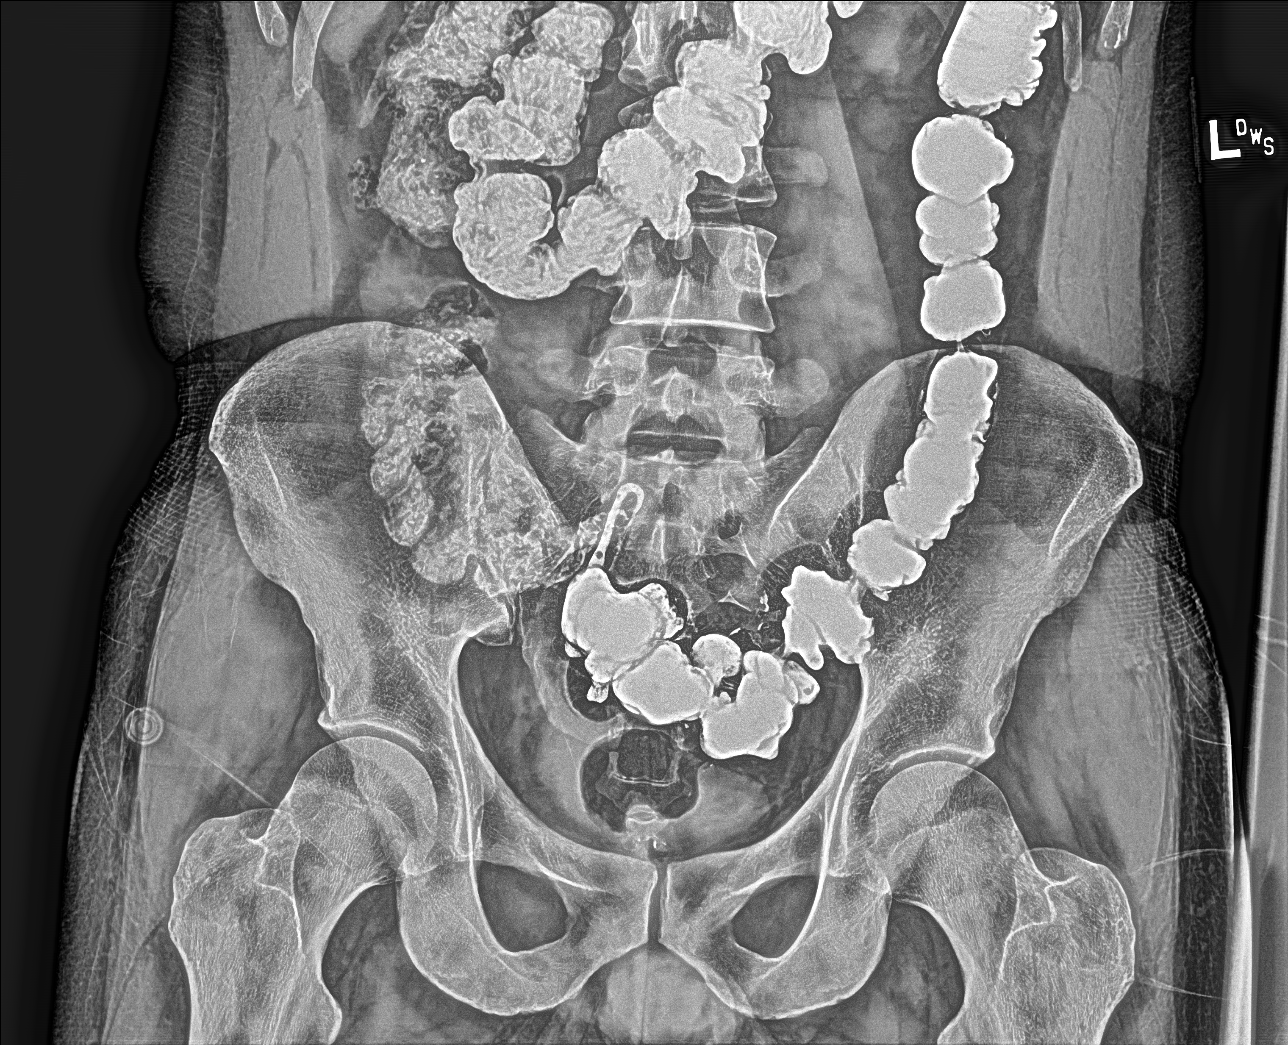

[4 of 4 positions shown; findings below may reference images not displayed]

FINDINGS: Percutaneous gastrojejunostomy tube is identified within the left
upper quadrant of the abdomen. Retention balloon for the GJ tube is
in the projection of the stomach. The jejunostomy portion of the
tube courses into the gastric fundus and above the GE junction
terminating within the distal esophagus. Enteric contrast material
is identified within the colon.
IMPRESSION: Jejunostomy tube terminates within the distal esophagus. This will
require revision such that the jejunostomy tube terminates in the
jejunum.

## 2022-12-09 ENCOUNTER — Ambulatory Visit
Admission: EM | Admit: 2022-12-09 | Discharge: 2022-12-09 | Disposition: A | Discharge status: Home / Self Care | Payer: BC Managed Care – PPO | Attending: Physician Assistant | Admitting: Physician Assistant

## 2022-12-09 DIAGNOSIS — L309 Dermatitis, unspecified: Secondary | ICD-10-CM

## 2022-12-09 MED ORDER — PREDNISONE 20 MG PO TABS: 40.0000 mg | ORAL_TABLET | Freq: Every day | ORAL | 0 refills | Status: AC

## 2022-12-09 NOTE — ED Provider Notes (Signed)
[facility]    CSN: [id number] Arrival date & time: 12/09/22  0817      History   Chief Complaint Chief Complaint  Patient presents with   Rash         HPI Joshua Morton is a 27 y.o. male.   Patient here today for evaluation of rash to his face that started yesterday. 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 He denies any new facial products that might have triggered symptoms. He reports that rash is mildly itchy but feels warm to touch. He has tried benadryl without significant improvement. He denies rash elsewhere. He has not had any trouble breathing or swallowing or facial swelling. He has not had any vomiting or diarrhea.   The history is provided by the patient.  Rash Associated symptoms: no fever and no shortness of breath     Past Medical History:  Diagnosis Date   Acute pancreatitis 09/04/2019   Acute respiratory failure with hypoxia ([place])    Attempted suicide ([place])    GERD (gastroesophageal reflux disease) 09/04/2019   Pancreatitis, alcoholic, acute [id number]    Patient Active Problem List   Diagnosis Date Noted   Alcoholic pancreatitis 0000000   Essential hypertension    Tachycardia    Abdominal pain    Fatty liver 09/04/2019   Alcohol abuse 09/04/2019   GERD (gastroesophageal reflux disease) 09/04/2019    History reviewed. No pertinent surgical history.     Home Medications    Prior to Admission medications   Medication Sig Start Date End Date Taking? Authorizing Provider  predniSONE (DELTASONE) 20 MG tablet Take 2 tablets (40 mg total) by mouth daily with breakfast for 5 days. 12/09/22 12/14/22 Yes Francene Finders, PA-C  ipratropium (ATROVENT) 0.06 % nasal spray Place 2 sprays into both nostrils 4 (four) times daily. 06/21/21   Margarette Canada, NP    Family History Family History  Problem Relation Age of Onset   Hypertension Mother      Social History Social History   Tobacco Use   Smoking status: Former    Packs/day: 0.10    Types: Cigarettes   Smokeless tobacco: Current   Tobacco comments:    vapes enough for about 1-2 cigarettes/day  Vaping Use   Vaping Use: Every day  Substance Use Topics   Alcohol use: Yes    Comment: occ   Drug use: Yes    Types: Marijuana     Allergies   Patient has no known allergies.   Review of Systems Review of Systems  Constitutional:  Negative for chills and fever.  HENT:  Negative for congestion and trouble swallowing.   Eyes:  Negative for discharge and redness.  Respiratory:  Negative for shortness of breath.   Skin:  Positive for rash.  Neurological:  Negative for numbness.     Physical Exam Triage Vital Signs ED Triage Vitals  Enc Vitals Group     BP      Pulse      Resp      Temp      Temp src      SpO2      Weight      Height      Head Circumference      Peak Flow      Pain Score      Pain Loc      Pain  Edu?      Excl. in [place]?    No data found.  Updated Vital Signs BP 111/66 (BP Location: Left Arm)   Pulse 67   Temp 98 F (36.7 C) (Oral)   Resp 16   SpO2 98%      Physical Exam Vitals and nursing note reviewed.  Constitutional:      General: He is not in acute distress.    Appearance: Normal appearance. He is not ill-appearing.  HENT:     Head: Normocephalic and atraumatic.  Eyes:     Conjunctiva/sclera: Conjunctivae normal.  Cardiovascular:     Rate and Rhythm: Normal rate.  Pulmonary:     Effort: Pulmonary effort is normal.  Skin:    Comments: Micropapular rash diffusely to right side of face with mild erythema, no swelling  Neurological:     Mental Status: He is alert.  Psychiatric:        Mood and Affect: Mood normal.        Behavior: Behavior normal.        Thought Content: Thought content normal.      UC Treatments / Results  Labs (all labs ordered are listed, but only abnormal results are displayed) Labs  Reviewed - No data to display  EKG   Radiology No results found.  Procedures Procedures (including critical care time)  Medications Ordered in UC Medications - No data to display  Initial Impression / Assessment and Plan / UC Course  I have reviewed the triage vital signs and the nursing notes.  Pertinent labs & imaging results that were available during my care of the patient were reviewed by me and considered in my medical decision making (see chart for details).    Unclear etiology of rash but does appear to be some form of contact related rash. Will trial steroid burst for treatment of unknown contact dermatitis. Recommended follow up if no gradual improvement or with any further concerns. Patient declines steroid injection in office.   Final Clinical Impressions(s) / UC Diagnoses   Final diagnoses:  Dermatitis   Discharge Instructions   None    ED Prescriptions     Medication Sig Dispense Auth. Provider   predniSONE (DELTASONE) 20 MG tablet Take 2 tablets (40 mg total) by mouth daily with breakfast for 5 days. 10 tablet Francene Finders, PA-C      PDMP not reviewed this encounter.   Francene Finders, PA-C 12/09/22 1000

## 2022-12-09 NOTE — ED Triage Notes (Signed)
Pt c/o possible contact dermatitis onset ~ Friday rash is on face states tried benadryl at home.

## 2023-02-17 ENCOUNTER — Encounter (HOSPITAL_COMMUNITY): Payer: Self-pay

## 2023-02-17 ENCOUNTER — Ambulatory Visit (INDEPENDENT_AMBULATORY_CARE_PROVIDER_SITE_OTHER): Payer: Self-pay

## 2023-02-17 ENCOUNTER — Ambulatory Visit (HOSPITAL_COMMUNITY)
Admission: EM | Admit: 2023-02-17 | Discharge: 2023-02-17 | Disposition: A | Discharge status: Home / Self Care | Payer: Self-pay | Attending: Physician Assistant | Admitting: Physician Assistant

## 2023-02-17 DIAGNOSIS — S61011A Laceration without foreign body of right thumb without damage to nail, initial encounter: Secondary | ICD-10-CM

## 2023-02-17 DIAGNOSIS — Z23 Encounter for immunization: Secondary | ICD-10-CM

## 2023-02-17 MED ORDER — TETANUS-DIPHTH-ACELL PERTUSSIS 5-2.5-18.5 LF-MCG/0.5 IM SUSY
PREFILLED_SYRINGE | INTRAMUSCULAR | Status: AC
Start: 2023-02-17 — End: ?
  Filled 2023-02-17: qty 0.5

## 2023-02-17 MED ORDER — LIDOCAINE HCL (PF) 1 % IJ SOLN
INTRAMUSCULAR | Status: AC
Start: 2023-02-17 — End: ?
  Filled 2023-02-17: qty 4

## 2023-02-17 MED ORDER — TETANUS-DIPHTH-ACELL PERTUSSIS 5-2.5-18.5 LF-MCG/0.5 IM SUSY
0.5000 mL | PREFILLED_SYRINGE | Freq: Once | INTRAMUSCULAR | Status: AC
  Administered 2023-02-17: 0.5 mL via INTRAMUSCULAR

## 2023-02-17 NOTE — Discharge Instructions (Addendum)
We placed 4 sutures today.  Please return in 10 to 14 days to have these removed.  Keep this area clean with soap and water.  We updated your tetanus today.  If you have any signs of infection including drainage, redness, pain, swelling, decreased range of motion in your hand, numbness or tingling in the hand you need to be seen immediately.

## 2023-02-17 NOTE — ED Notes (Signed)
Patient did not want his right thumb wrapped with a dressing.

## 2023-02-17 NOTE — ED Triage Notes (Signed)
Pt presents with small lac to RT posterior thunm. Pt tender to touch over bony prominences . Erin at bed side to eval Pt

## 2023-02-17 NOTE — ED Provider Notes (Addendum)
[facility]    CSN: [id number] Arrival date & time: 02/17/23  1700      History   Chief Complaint No chief complaint on file.   HPI Joshua Morton is a 27 y.o. male.   Patient presents today with a several hour history of laceration to his right thumb.  Reports that he accidentally cut his thumb with a box cutter.  He is right-handed.  Denies any numbness or paresthesias in the hand.  He is unsure when his last tetanus was.  He immediately cleaned the area and applied a Band-Aid.  He does report significant pain which is rated 8 on a 0-10 pain scale, described as aching, no aggravating alleviating factors identified.    Past Medical History:  Diagnosis Date   Acute pancreatitis 09/04/2019   Acute respiratory failure with hypoxia (HCC)    Attempted suicide (HCC)    GERD (gastroesophageal reflux disease) 09/04/2019   Pancreatitis, alcoholic, acute 09/04/2019    Patient Active Problem List   Diagnosis Date Noted   Alcoholic pancreatitis 03/15/2021   Essential hypertension    Tachycardia    Abdominal pain    Fatty liver 09/04/2019   Alcohol abuse 09/04/2019   GERD (gastroesophageal reflux disease) 09/04/2019    Past Surgical History:  Procedure Laterality Date   IR GASTR TUBE CONVERT GASTR-JEJ PER W/FL MOD SED         Home Medications    Prior to Admission medications   Not on File    Family History Family History  Problem Relation Age of Onset   Hypertension Mother     Social History Social History   Tobacco Use   Smoking status: Former    Packs/day: .1    Types: Cigarettes   Smokeless tobacco: Current   Tobacco comments:    vapes enough for about 1-2 cigarettes/day  Vaping Use   Vaping Use: Every day   Substances: Nicotine, Flavoring  Substance Use Topics   Alcohol use: Not Currently    Comment: occ   Drug use: Yes    Types: Marijuana     Allergies   Patient has no known allergies.   Review of Systems Review of  Systems  Constitutional:  Positive for activity change. Negative for appetite change, fatigue and fever.  Musculoskeletal:  Negative for arthralgias and myalgias.  Skin:  Positive for wound. Negative for color change.  Neurological:  Negative for weakness and numbness.     Physical Exam Triage Vital Signs ED Triage Vitals  Enc Vitals Group     BP 02/17/23 1759 110/66     Pulse Rate 02/17/23 1759 (!) 58     Resp 02/17/23 1759 16     Temp 02/17/23 1759 99.4 F (37.4 C)     Temp Source 02/17/23 1759 Oral     SpO2 02/17/23 1759 97 %     Weight --      Height --      Head Circumference --      Peak Flow --      Pain Score 02/17/23 1801 8     Pain Loc --      Pain Edu? --      Excl. in GC? --    No data found.  Updated Vital Signs BP 110/66 (BP Location: Left Arm)   Pulse (!) 58   Temp 99.4 F (37.4 C) (Oral)   Resp 16   SpO2 97%   Visual Acuity Right Eye Distance:   Left  Eye Distance:   Bilateral Distance:    Right Eye Near:   Left Eye Near:    Bilateral Near:     Physical Exam Vitals reviewed.  Constitutional:      General: He is awake.     Appearance: Normal appearance. He is well-developed. He is not ill-appearing.     Comments: Very pleasant male appears stated age in no acute distress sitting comfortably in exam room  HENT:     Head: Normocephalic and atraumatic.     Mouth/Throat:     Pharynx: No oropharyngeal exudate, posterior oropharyngeal erythema or uvula swelling.  Cardiovascular:     Comments: Capillary refill within 2 seconds right thumb Pulmonary:     Effort: Pulmonary effort is normal. No tachypnea, accessory muscle usage or respiratory distress.  Abdominal:     General: Bowel sounds are normal.     Palpations: Abdomen is soft.     Tenderness: There is no abdominal tenderness.  Musculoskeletal:     Right hand: Laceration present. No swelling. Normal range of motion. Normal sensation. There is no disruption of two-point discrimination. Normal  capillary refill.     Comments: Right thumb: 4 cm laceration over dorsal MCP joint.  Normal active range of motion at joint.  Hand neurovascularly intact.  Neurological:     Mental Status: He is alert.  Psychiatric:        Behavior: Behavior is cooperative.      UC Treatments / Results  Labs (all labs ordered are listed, but only abnormal results are displayed) Labs Reviewed - No data to display  EKG   Radiology DG Finger Thumb Right  Result Date: 02/17/2023 CLINICAL DATA:  laceration over MCP EXAM: RIGHT THUMB 2+V COMPARISON:  None Available. FINDINGS: There is no evidence of fracture or dislocation. There is no evidence of arthropathy or other focal bone abnormality. No radiopaque retained foreign body soft tissues are unremarkable. IMPRESSION: No acute displaced fracture, dislocation or retained radiopaque foreign body within the RIGHT thumb. Electronically Signed   By: Roanna Banning M.D.   On: 02/17/2023 17:32    Procedures Laceration Repair  Date/Time: 02/17/2023 6:41 PM  Performed by: Jeani Hawking, PA-C Authorized by: Jeani Hawking, PA-C   Consent:    Consent obtained:  Verbal   Consent given by:  Patient   Risks discussed:  Infection, pain, poor cosmetic result, poor wound healing and need for additional repair   Alternatives discussed:  Observation and referral Universal protocol:    Procedure explained and questions answered to patient or proxy's satisfaction: yes     Patient identity confirmed:  Verbally with patient Anesthesia:    Anesthesia method:  Local infiltration   Local anesthetic:  Lidocaine 2% w/o epi Laceration details:    Location:  Finger   Finger location:  R thumb   Length (cm):  4 Pre-procedure details:    Preparation:  Patient was prepped and draped in usual sterile fashion Exploration:    Hemostasis achieved with:  Direct pressure   Imaging obtained: x-ray     Imaging outcome: foreign body not noted     Wound exploration: entire depth of  wound visualized     Contaminated: no   Treatment:    Area cleansed with:  Chlorhexidine   Amount of cleaning:  Standard   Irrigation solution:  Tap water   Irrigation volume:  50 mL   Irrigation method:  Syringe   Debridement:  None Skin repair:    Repair method:  Sutures   Suture size:  5-0   Suture material:  Prolene   Suture technique:  Simple interrupted   Number of sutures:  4 Approximation:    Approximation:  Close Repair type:    Repair type:  Simple Post-procedure details:    Dressing:  Non-adherent dressing  (including critical care time)  Medications Ordered in UC Medications  Tdap (BOOSTRIX) injection 0.5 mL (has no administration in time range)    Initial Impression / Assessment and Plan / UC Course  I have reviewed the triage vital signs and the nursing notes.  Pertinent labs & imaging results that were available during my care of the patient were reviewed by me and considered in my medical decision making (see chart for details).     Hand is neurovascularly intact.  X-ray was obtained given significant tenderness which showed no acute osseous abnormality.  Laceration was closed with 4 simple interrupted sutures.  See procedure note above.  Tetanus was updated.  Wound care discussed with patient.  He is to return in 10 to 14 days to have sutures removed.  He was provided a work excuse note for several days to allow appropriate healing.  We discussed that he should avoid strenuous activities that involve his hand in order to prevent dehiscence.  If he has any worsening or changing symptoms he is to return for reevaluation.  All questions were answered to patient satisfaction.  Final Clinical Impressions(s) / UC Diagnoses   Final diagnoses:  Laceration of right thumb without foreign body without damage to nail, initial encounter     Discharge Instructions      We placed 4 sutures today.  Please return in 10 to 14 days to have these removed.  Keep this area  clean with soap and water.  We updated your tetanus today.  If you have any signs of infection including drainage, redness, pain, swelling, decreased range of motion in your hand, numbness or tingling in the hand you need to be seen immediately.     ED Prescriptions   None    PDMP not reviewed this encounter.   Jeani Hawking, PA-C 02/17/23 1841    Jeani Hawking, PA-C 02/17/23 1842

## 2023-02-17 NOTE — ED Triage Notes (Signed)
Patient cut his right thumb with a box cutter. Bandage in place.

## 2023-07-31 ENCOUNTER — Ambulatory Visit: Payer: Self-pay

## 2023-10-16 ENCOUNTER — Other Ambulatory Visit: Payer: Self-pay

## 2023-10-16 ENCOUNTER — Ambulatory Visit
Admission: EM | Admit: 2023-10-16 | Discharge: 2023-10-16 | Disposition: A | Discharge status: Home / Self Care | Payer: Managed Care, Other (non HMO) | Attending: Physician Assistant | Admitting: Physician Assistant

## 2023-10-16 ENCOUNTER — Encounter: Payer: Self-pay | Admitting: Emergency Medicine

## 2023-10-16 DIAGNOSIS — Z1152 Encounter for screening for COVID-19: Secondary | ICD-10-CM | POA: Diagnosis not present

## 2023-10-16 DIAGNOSIS — R0602 Shortness of breath: Secondary | ICD-10-CM | POA: Diagnosis not present

## 2023-10-16 DIAGNOSIS — J069 Acute upper respiratory infection, unspecified: Secondary | ICD-10-CM | POA: Diagnosis not present

## 2023-10-16 LAB — POCT INFLUENZA A/B
Influenza A, POC: NEGATIVE
Influenza B, POC: NEGATIVE

## 2023-10-16 NOTE — ED Triage Notes (Signed)
Pt reports nasal & chest congestion, productive cough, sore throat, headache, and fatigue x2 days. Some coughing episodes have left him wheezing and short of breath. No OTC med use at home. Pt reports daughter is sick at home with ear infection and another family member has walking pneumonia.

## 2023-10-17 LAB — SARS CORONAVIRUS 2 (TAT 6-24 HRS): SARS Coronavirus 2: NEGATIVE

## 2023-10-25 ENCOUNTER — Encounter: Payer: Self-pay | Admitting: Physician Assistant

## 2024-01-21 ENCOUNTER — Ambulatory Visit
Admission: RE | Admit: 2024-01-21 | Discharge: 2024-01-21 | Disposition: A | Discharge status: Home / Self Care | Payer: Self-pay | Source: Ambulatory Visit | Attending: Family Medicine | Admitting: Family Medicine

## 2024-01-21 VITALS — BP 141/95 | HR 105 | Temp 98.8°F | Resp 16 | Ht 71.0 in | Wt 150.0 lb

## 2024-01-21 DIAGNOSIS — M25512 Pain in left shoulder: Secondary | ICD-10-CM

## 2024-01-21 DIAGNOSIS — M542 Cervicalgia: Secondary | ICD-10-CM

## 2024-01-21 DIAGNOSIS — R200 Anesthesia of skin: Secondary | ICD-10-CM | POA: Diagnosis not present

## 2024-01-21 MED ORDER — CYCLOBENZAPRINE HCL 5 MG PO TABS: 5.0000 mg | ORAL_TABLET | Freq: Three times a day (TID) | ORAL | 0 refills | Status: AC | PRN

## 2024-01-21 MED ORDER — DEXAMETHASONE SODIUM PHOSPHATE 10 MG/ML IJ SOLN
10.0000 mg | Freq: Once | INTRAMUSCULAR | Status: AC
  Administered 2024-01-21: 10 mg via INTRAMUSCULAR

## 2024-01-21 MED ORDER — MELOXICAM 15 MG PO TABS: 15.0000 mg | ORAL_TABLET | Freq: Every day | ORAL | 0 refills | Status: DC

## 2024-01-21 NOTE — ED Triage Notes (Signed)
 Pt c/o neck injury x1 mon. States Torn muscle between neck and arm causing extreme discomfort and pain - Entered by patient. 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 Pain starts in L side of neck & shoulder then radiates down arm which causes numbness. Having limited ROM.

## 2024-01-21 NOTE — Discharge Instructions (Addendum)
 Schedule a follow up appointment with Antony Contras or [facility] to better evaluate whether you need to to have work place restrictions.   If medication was prescribed, stop by the pharmacy to pick up your prescriptions.  For your  pain, Take [phone] mg Tylenol three times a day, take Meloxicam daily and muscle relaxer,  as needed for pain. Rest and elevate the affected painful area.  Apply warm compresses intermittently, as needed.  As pain recedes, begin normal activities slowly as tolerated.  Follow up with primary care provider or an orthopedic provider, if symptoms persist.  Watch for worsening symptoms such as an increasing weakness or loss of sensation, increasing pain and/or the loss of bladder or bowel function. Should any of these occur, go to the emergency department immediately.

## 2024-01-21 NOTE — ED Provider Notes (Signed)
 [facility]    CSN: [id number] Arrival date & time: 01/21/24  0815      History   Chief Complaint Chief Complaint  Patient presents with   Neck Injury    Appt    HPI  HPI Joshua Morton is a 28 y.o. male.   Joshua Morton presents for left lower neck pain that radiates to his shoulder and down his left arm. He moving wood about a month ago.  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  He is right handed.    Has been taking ibuprofen and Tylenol with initial relief but now its not helping.  Pain described as burning and pulling sensation. Pain is 5/10 at rest. Pain can get 10/10 with work.  Pain prevents him from sleeping well. Has intermittent headaches with pulling sensation behind his head.    Thinks he may have pulled a muscle before but the pain and duration is difficult.         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   Alcoholic pancreatitis 03/15/2021   Panic disorder 12/09/2019   Abdominal pain 12/08/2019   Essential hypertension    Tachycardia    Fatty liver 09/04/2019   GERD (gastroesophageal reflux disease) 09/04/2019   Procedure and treatment not carried out because of patient's decision for other reasons 06/26/2012   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 Authorizing Provider   acetaminophen (TYLENOL) 500 MG tablet Take 500 mg by mouth every 8 (eight) hours as needed. 01/16/22   [provider]  Multiple Vitamin tablet Take 1 tablet by mouth daily. Patient not taking: Reported on 10/16/2023    [provider]  pantoprazole (PROTONIX) 40 MG tablet Take 40 mg by mouth daily. Patient not taking: Reported on 10/16/2023 08/03/23   [provider]    Family History Family History  Problem Relation Age of Onset   Hypertension Mother     Social History Social History   Tobacco Use   Smoking status: Former    Current packs/day: 0.10    Types: Cigarettes   Smokeless tobacco: Current   Tobacco comments:    vapes enough for about 1-2 cigarettes/day  Vaping Use   Vaping status: Every Day   Substances: Nicotine, Flavoring  Substance Use Topics   Alcohol use: Not Currently    Comment: occ   Drug use: Yes    Frequency: 3.0 times per week    Types: Marijuana     Allergies   Patient has no known allergies.   Review of Systems Review of Systems: egative unless otherwise stated in HPI.      Physical Exam Triage Vital Signs ED Triage Vitals  Encounter Vitals Group     BP 01/21/24 0833 (!) 141/95     Systolic BP Percentile --  Diastolic BP Percentile --      Pulse Rate 01/21/24 0833 (!) 105     Resp 01/21/24 0833 16     Temp 01/21/24 0833 98.8 F (37.1 C)     Temp Source 01/21/24 0833 Oral     SpO2 01/21/24 0833 95 %     Weight 01/21/24 0832 150 lb (68 kg)     Height 01/21/24 0832 5\' 11"  (1.803 m)     Head Circumference --      Peak Flow --      Pain Score 01/21/24 0837 5     Pain Loc --      Pain Education --      Exclude from Growth Chart --    No data found.  Updated Vital Signs BP (!) 141/95 (BP Location: Right Arm)   Pulse (!) 105   Temp 98.8 F (37.1 C) (Oral)   Resp 16   Ht 5\' 11"  (1.803 m)   Wt 68 kg   SpO2 95%   BMI 20.92 kg/m   Visual Acuity Right Eye Distance:   Left Eye Distance:    Bilateral Distance:    Right Eye Near:   Left Eye Near:    Bilateral Near:     Physical Exam GEN: well appearing male in no acute distress  CVS: well perfused  RESP: speaking in full sentences without pause, no respiratory distress  MSK:  Lumbar spine: - Inspection: no gross deformity or asymmetry, swelling or ecchymosis. No skin changes  - Palpation: No ***TTP over the spinous processes, ***bilateral lumbar paraspinal muscles, no SI joint tenderness bilaterally - ROM: full active ROM of the lumbar spine in flexion and extension but with mild pain ***with flexion/extension  - Strength: 5/5 strength of lower extremity in L4-S1 nerve root distributions b/l - Neuro: sensation intact in the L4-S1 nerve root distribution b/l, ***2+ L4 and S1 reflexes - Special testing: Negative straight leg raise SKIN: warm, dry, no overly skin rash or erythema    UC Treatments / Results  Labs (all labs ordered are listed, but only abnormal results are displayed) Labs Reviewed - No data to display  EKG   Radiology No results found.   Procedures Procedures (including critical care time)  Medications Ordered in UC Medications - No data to display  Initial Impression / Assessment and Plan / UC Course  I have reviewed the triage vital signs and the nursing notes.  Pertinent labs & imaging results that were available during my care of the patient were reviewed by me and considered in my medical decision making (see chart for details).      Pt is a 28 y.o.  male with *** days of *** back pain after ***.  Has history of ***low back pain.   Obtained ***lumbar plain films.  Xray personally interpreted by me were ***unremarkable for fracture, ***or significant malalignment.  Radiologist report reviewed and notes ***  Patient to gradually return to normal activities, as tolerated and continue ordinary activities within the limits permitted by pain. Prescribed Naproxen sodium *** and muscle relaxer  *** for pain relief.  Advised patient to avoid other NSAIDs while taking ***Naprosyn. Tylenol and Lidocaine patches PRN for multimodal pain relief. Counseled patient on red flag symptoms and when to seek immediate care.  ***No red flags suggesting cauda equina syndrome or progressive major motor weakness. Patient to follow up with orthopedic provider if symptoms do not improve with conservative treatment.  Return and ED precautions given.  Discussed MDM, treatment plan and plan for follow-up with patient who agrees with plan.   Final Clinical Impressions(s) / UC Diagnoses   Final diagnoses:  None   Discharge Instructions   None    ED Prescriptions   None    PDMP not reviewed this encounter.

## 2024-01-24 ENCOUNTER — Other Ambulatory Visit: Payer: Self-pay

## 2024-01-24 ENCOUNTER — Emergency Department (HOSPITAL_COMMUNITY)

## 2024-01-24 ENCOUNTER — Encounter (HOSPITAL_COMMUNITY): Payer: Self-pay | Admitting: Emergency Medicine

## 2024-01-24 ENCOUNTER — Inpatient Hospital Stay (HOSPITAL_COMMUNITY)
Admission: EM | Admit: 2024-01-24 | Discharge: 2024-01-28 | DRG: 439 | Disposition: A | Discharge status: Home / Self Care | Attending: Internal Medicine | Admitting: Internal Medicine

## 2024-01-24 DIAGNOSIS — K861 Other chronic pancreatitis: Secondary | ICD-10-CM | POA: Diagnosis not present

## 2024-01-24 DIAGNOSIS — I10 Essential (primary) hypertension: Secondary | ICD-10-CM | POA: Diagnosis present

## 2024-01-24 DIAGNOSIS — K863 Pseudocyst of pancreas: Principal | ICD-10-CM | POA: Diagnosis present

## 2024-01-24 DIAGNOSIS — J90 Pleural effusion, not elsewhere classified: Secondary | ICD-10-CM | POA: Diagnosis present

## 2024-01-24 DIAGNOSIS — Z9151 Personal history of suicidal behavior: Secondary | ICD-10-CM

## 2024-01-24 DIAGNOSIS — K219 Gastro-esophageal reflux disease without esophagitis: Secondary | ICD-10-CM | POA: Diagnosis present

## 2024-01-24 DIAGNOSIS — E8809 Other disorders of plasma-protein metabolism, not elsewhere classified: Secondary | ICD-10-CM | POA: Diagnosis present

## 2024-01-24 DIAGNOSIS — K859 Acute pancreatitis without necrosis or infection, unspecified: Principal | ICD-10-CM | POA: Diagnosis present

## 2024-01-24 DIAGNOSIS — R1012 Left upper quadrant pain: Secondary | ICD-10-CM

## 2024-01-24 DIAGNOSIS — D733 Abscess of spleen: Secondary | ICD-10-CM | POA: Diagnosis present

## 2024-01-24 DIAGNOSIS — F17290 Nicotine dependence, other tobacco product, uncomplicated: Secondary | ICD-10-CM | POA: Diagnosis present

## 2024-01-24 DIAGNOSIS — Z8249 Family history of ischemic heart disease and other diseases of the circulatory system: Secondary | ICD-10-CM

## 2024-01-24 DIAGNOSIS — Z791 Long term (current) use of non-steroidal anti-inflammatories (NSAID): Secondary | ICD-10-CM

## 2024-01-24 LAB — CBC
HCT: 43.6 % (ref 39.0–52.0)
Hemoglobin: 14.5 g/dL (ref 13.0–17.0)
MCH: 32.3 pg (ref 26.0–34.0)
MCHC: 33.3 g/dL (ref 30.0–36.0)
MCV: 97.1 fL (ref 80.0–100.0)
Platelets: 414 10*3/uL — ABNORMAL HIGH (ref 150–400)
RBC: 4.49 MIL/uL (ref 4.22–5.81)
RDW: 12.7 % (ref 11.5–15.5)
WBC: 12.6 10*3/uL — ABNORMAL HIGH (ref 4.0–10.5)
nRBC: 0 % (ref 0.0–0.2)

## 2024-01-24 LAB — BASIC METABOLIC PANEL WITH GFR
Anion gap: 8 (ref 5–15)
BUN: 16 mg/dL (ref 6–20)
CO2: 28 mmol/L (ref 22–32)
Calcium: 8.8 mg/dL — ABNORMAL LOW (ref 8.9–10.3)
Chloride: 103 mmol/L (ref 98–111)
Creatinine, Ser: 0.91 mg/dL (ref 0.61–1.24)
GFR, Estimated: >60 mL/min (ref >60)
Glucose, Bld: 129 mg/dL — ABNORMAL HIGH (ref 70–99)
Potassium: 3.9 mmol/L (ref 3.5–5.1)
Sodium: 139 mmol/L (ref 135–145)

## 2024-01-24 LAB — TROPONIN I (HIGH SENSITIVITY)
Troponin I (High Sensitivity): 6 ng/L (ref <18)
Troponin I (High Sensitivity): 10 ng/L (ref <18)

## 2024-01-24 LAB — HEPATIC FUNCTION PANEL
ALT: 38 U/L (ref 0–44)
AST: 38 U/L (ref 15–41)
Albumin: 2.7 g/dL — ABNORMAL LOW (ref 3.5–5.0)
Alkaline Phosphatase: 62 U/L (ref 38–126)
Bilirubin, Direct: 0.2 mg/dL (ref 0.0–0.2)
Indirect Bilirubin: 0.4 mg/dL (ref 0.3–0.9)
Total Bilirubin: 0.6 mg/dL (ref 0.0–1.2)
Total Protein: 6.7 g/dL (ref 6.5–8.1)

## 2024-01-24 LAB — D-DIMER, QUANTITATIVE: D-Dimer, Quant: 2.76 ug{FEU}/mL — ABNORMAL HIGH (ref 0.00–0.50)

## 2024-01-24 LAB — LIPASE, BLOOD: Lipase: 94 U/L — ABNORMAL HIGH (ref 11–51)

## 2024-01-24 LAB — TRIGLYCERIDES: Triglycerides: 28 mg/dL (ref <150)

## 2024-01-24 LAB — HIV ANTIBODY (ROUTINE TESTING W REFLEX): HIV Screen 4th Generation wRfx: NONREACTIVE

## 2024-01-24 MED ORDER — LACTATED RINGERS IV SOLN: INTRAVENOUS | Status: AC

## 2024-01-24 MED ORDER — OXYCODONE HCL 5 MG PO TABS
5.0000 mg | ORAL_TABLET | ORAL | Status: DC | PRN
  Administered 2024-01-24 – 2024-01-25 (×2): 5 mg via ORAL
  Filled 2024-01-24 (×2): qty 1

## 2024-01-24 MED ORDER — ACETAMINOPHEN 325 MG PO TABS
650.0000 mg | ORAL_TABLET | Freq: Four times a day (QID) | ORAL | Status: DC | PRN
  Administered 2024-01-25 – 2024-01-26 (×2): 650 mg via ORAL
  Filled 2024-01-24 (×2): qty 2

## 2024-01-24 MED ORDER — POLYETHYLENE GLYCOL 3350 17 G PO PACK: 17.0000 g | PACK | Freq: Every day | ORAL | Status: DC | PRN

## 2024-01-24 MED ORDER — PIPERACILLIN-TAZOBACTAM 3.375 G IVPB 30 MIN
3.3750 g | Freq: Once | INTRAVENOUS | Status: AC
  Administered 2024-01-24: 3.375 g via INTRAVENOUS
  Filled 2024-01-24: qty 50

## 2024-01-24 MED ORDER — METHOCARBAMOL 500 MG PO TABS
500.0000 mg | ORAL_TABLET | Freq: Once | ORAL | Status: DC
  Filled 2024-01-24: qty 1

## 2024-01-24 MED ORDER — LIDOCAINE 5 % EX PTCH
1.0000 | MEDICATED_PATCH | CUTANEOUS | Status: DC
  Administered 2024-01-24 – 2024-01-26 (×3): 1 via TRANSDERMAL
  Filled 2024-01-24 (×4): qty 1

## 2024-01-24 MED ORDER — PIPERACILLIN-TAZOBACTAM 3.375 G IVPB
3.3750 g | Freq: Three times a day (TID) | INTRAVENOUS | Status: DC
  Administered 2024-01-24 – 2024-01-27 (×8): 3.375 g via INTRAVENOUS
  Filled 2024-01-24 (×8): qty 50

## 2024-01-24 MED ORDER — MORPHINE SULFATE (PF) 4 MG/ML IV SOLN
4.0000 mg | Freq: Once | INTRAVENOUS | Status: AC
  Administered 2024-01-24: 4 mg via INTRAVENOUS
  Filled 2024-01-24: qty 1

## 2024-01-24 MED ORDER — ACETAMINOPHEN 650 MG RE SUPP: 650.0000 mg | Freq: Four times a day (QID) | RECTAL | Status: DC | PRN

## 2024-01-24 MED ORDER — SODIUM CHLORIDE 0.9 % IV BOLUS
1000.0000 mL | Freq: Once | INTRAVENOUS | Status: AC
  Administered 2024-01-24: 1000 mL via INTRAVENOUS

## 2024-01-24 MED ORDER — IOHEXOL 350 MG/ML SOLN
75.0000 mL | Freq: Once | INTRAVENOUS | Status: AC | PRN
  Administered 2024-01-24: 75 mL via INTRAVENOUS

## 2024-01-24 MED ORDER — HEPARIN SODIUM (PORCINE) 5000 UNIT/ML IJ SOLN
5000.0000 [IU] | Freq: Three times a day (TID) | INTRAMUSCULAR | Status: DC
Start: 2024-01-24 — End: 2024-01-24
  Administered 2024-01-24: 5000 [IU] via SUBCUTANEOUS
  Filled 2024-01-24: qty 1

## 2024-01-24 MED ORDER — KETOROLAC TROMETHAMINE 15 MG/ML IJ SOLN
15.0000 mg | Freq: Once | INTRAMUSCULAR | Status: AC
  Administered 2024-01-24: 15 mg via INTRAVENOUS
  Filled 2024-01-24: qty 1

## 2024-01-24 MED ORDER — HYDROMORPHONE HCL 1 MG/ML IJ SOLN
0.5000 mg | INTRAMUSCULAR | Status: DC | PRN
Start: 2024-01-24 — End: 2024-01-27
  Administered 2024-01-24: 0.5 mg via INTRAVENOUS
  Administered 2024-01-25 – 2024-01-26 (×8): 1 mg via INTRAVENOUS
  Administered 2024-01-26: 0.5 mg via INTRAVENOUS
  Administered 2024-01-26 – 2024-01-27 (×2): 1 mg via INTRAVENOUS
  Filled 2024-01-24 (×13): qty 1

## 2024-01-24 MED ORDER — FUROSEMIDE 10 MG/ML IJ SOLN: 20.0000 mg | Freq: Once | INTRAMUSCULAR | Status: DC

## 2024-01-24 NOTE — Consult Note (Signed)
 Reason for Consult/Chief Complaint: splenic abscess Consultant: Montel Clock, PA  Joshua Morton is an 28 y.o. male.   HPI: 79F p/w L chest pain. Medical hx notable for alcohol abuse, cessation ~3y ago, replaced with vaping. Also with h/o alcoholic pancreatitis and h/o GJ tube placement, (2023) now removed. Chest pain was acute in onset with no associated symptoms. Denies prior trauma to the chest/abdomen.    Past Medical History:  Diagnosis Date   Acute pancreatitis 09/04/2019   Acute respiratory failure with hypoxia (HCC)    Attempted suicide (HCC)    GERD (gastroesophageal reflux disease) 09/04/2019   Pancreatitis, alcoholic, acute 09/04/2019    Past Surgical History:  Procedure Laterality Date   IR GASTR TUBE CONVERT GASTR-JEJ PER W/FL MOD SED      Family History  Problem Relation Age of Onset   Hypertension Mother     Social History:  reports that he has quit smoking. His smoking use included cigarettes. He uses smokeless tobacco. He reports that he does not currently use alcohol. He reports current drug use. Frequency: 3.00 times per week. Drug: Marijuana.  Allergies: No Known Allergies  Medications: I have reviewed the patient's current medications.  Results for orders placed or performed during the hospital encounter of 01/24/24 (from the past 48 hours)  Basic metabolic panel     Status: Abnormal   Collection Time: 01/24/24  5:01 AM  Result Value Ref Range   Sodium 139 135 - 145 mmol/L   Potassium 3.9 3.5 - 5.1 mmol/L   Chloride 103 98 - 111 mmol/L   CO2 28 22 - 32 mmol/L   Glucose, Bld 129 (H) 70 - 99 mg/dL    Comment: Glucose reference range applies only to samples taken after fasting for at least 8 hours.   BUN 16 6 - 20 mg/dL   Creatinine, Ser 5.78 0.61 - 1.24 mg/dL   Calcium 8.8 (L) 8.9 - 10.3 mg/dL   GFR, Estimated >46 >96 mL/min    Comment: (NOTE) Calculated using the CKD-EPI Creatinine Equation (2021)    Anion gap 8 5 - 15    Comment:  Performed at [facility], [address] [place], [place] [address]  CBC     Status: Abnormal   Collection Time: 01/24/24  5:01 AM  Result Value Ref Range   WBC 12.6 (H) 4.0 - 10.5 K/uL   RBC 4.49 4.22 - 5.81 MIL/uL   Hemoglobin 14.5 13.0 - 17.0 g/dL   HCT 41.3 24.4 - 01.0 %   MCV 97.1 80.0 - 100.0 fL   MCH 32.3 26.0 - 34.0 pg   MCHC 33.3 30.0 - 36.0 g/dL   RDW 27.2 53.6 - 64.4 %   Platelets 414 (H) 150 - 400 K/uL   nRBC 0.0 0.0 - 0.2 %    Comment: Performed at [facility], [address] [place], [place] [address]  Troponin I (High Sensitivity)     Status: None   Collection Time: 01/24/24  5:01 AM  Result Value Ref Range   Troponin I (High Sensitivity) 6 <18 ng/L    Comment: (NOTE) Elevated high sensitivity troponin I (hsTnI) values and significant  changes across serial measurements may suggest ACS but many other  chronic and acute conditions are known to elevate hsTnI results.  Refer to the "Links" section for chest pain algorithms and additional  guidance. Performed at [facility], [address], [place], [place] [address]   Lipase, blood  Status: Abnormal   Collection Time: 01/24/24  6:44 AM  Result Value Ref Range   Lipase 94 (H) 11 - 51 U/L    Comment: Performed at [facility], [address], [place], [place] [address]  Hepatic function panel     Status: Abnormal   Collection Time: 01/24/24  6:44 AM  Result Value Ref Range   Total Protein 6.7 6.5 - 8.1 g/dL   Albumin 2.7 (L) 3.5 - 5.0 g/dL   AST 38 15 - 41 U/L   ALT 38 0 - 44 U/L   Alkaline Phosphatase 62 38 - 126 U/L   Total Bilirubin 0.6 0.0 - 1.2 mg/dL   Bilirubin, Direct 0.2 0.0 - 0.2 mg/dL   Indirect Bilirubin 0.4 0.3 - 0.9 mg/dL    Comment: Performed at [facility], [address], [place], [place] [address]  Troponin I (High Sensitivity)     Status: None   Collection Time: 01/24/24  6:57 AM  Result Value Ref Range   Troponin I (High Sensitivity) 10 <18 ng/L     Comment: (NOTE) Elevated high sensitivity troponin I (hsTnI) values and significant  changes across serial measurements may suggest ACS but many other  chronic and acute conditions are known to elevate hsTnI results.  Refer to the "Links" section for chest pain algorithms and additional  guidance. Performed at [facility], [address], [place], [place] [address]   D-dimer, quantitative     Status: Abnormal   Collection Time: 01/24/24  9:05 AM  Result Value Ref Range   D-Dimer, Quant 2.76 (H) 0.00 - 0.50 ug/mL-FEU    Comment: (NOTE) At the manufacturer cut-off value of 0.5 g/mL FEU, this assay has a negative predictive value of 95-100%.This assay is intended for use in conjunction with a clinical pretest probability (PTP) assessment model to exclude pulmonary embolism (PE) and deep venous thrombosis (DVT) in outpatients suspected of PE or DVT. Results should be correlated with clinical presentation. Performed at [facility], [address] [place], [place] [address]   HIV Antibody (routine testing w rflx)     Status: None   Collection Time: 01/24/24  6:00 PM  Result Value Ref Range   HIV Screen 4th Generation wRfx Non Reactive Non Reactive    Comment: Performed at [facility], [address] [place], [place] [address]  Triglycerides     Status: None   Collection Time: 01/24/24  6:00 PM  Result Value Ref Range   Triglycerides 28 <150 mg/dL    Comment: Performed at [facility], [address] [place], [place] [address]    CT ABDOMEN PELVIS W CONTRAST Result Date: 01/24/2024 CLINICAL DATA:  Abdominal pain, acute, nonlocalized EXAM: CT ABDOMEN AND PELVIS WITH CONTRAST TECHNIQUE: Multidetector CT imaging of the abdomen and pelvis was performed using the standard protocol following bolus administration of intravenous contrast. RADIATION DOSE REDUCTION: This exam was performed according to the departmental dose-optimization program which includes  automated exposure control, adjustment of the mA and/or kV according to patient size and/or use of iterative reconstruction technique. CONTRAST:  75mL OMNIPAQUE IOHEXOL 350 MG/ML SOLN COMPARISON:  CT angiography chest 01/24/2024, CT abdomen pelvis 03/15/2021 FINDINGS: Lower chest: Left pleural effusion. Please see separately dictated CT angiography chest 01/24/2024. Hepatobiliary: No focal liver abnormality. No gallstones, gallbladder wall thickening, or pericholecystic fluid. No biliary dilatation. Pancreas: Interval development of a lobulated peripherally enhancing perinephric fluid density lesion that is likely arising  from the pancreatic tail and measuring up to 7.5 x 7 cm (best evaluated on coronal imaging, 6:25; 7:91)). Finding appears inseparable from the pancreatic tail and kidney. Coarse calcifications along the pancreatic tail may represent chronic pancreatitis. No main pancreatic ductal dilatation. Spleen: Spleen is normal in size. There is a subcapsular fluid density lesion along the spleen/left upper quadrant measuring up to 6.9 x 1.9 cm (6: 32-44). Adrenals/Urinary Tract: No adrenal nodule bilaterally. Bilateral kidneys enhance and excrete symmetrically. Mass effect on the left interpolar region of the kidney from perinephric fluid collection as described in the pancreatic section. No hydronephrosis. No hydroureter.  No nephroureterolithiasis. The urinary bladder is unremarkable. Stomach/Bowel: Question greater curvature of the stomach bowel wall thickening and hazy contour (6:52). No evidence of bowel wall thickening or dilatation. Appendix appears normal. Vascular/Lymphatic: No abdominal aorta or iliac aneurysm. Mild atherosclerotic plaque of the aorta and its branches. No abdominal, pelvic, or inguinal lymphadenopathy. Reproductive: Prostate is unremarkable. Other: Trace volume left upper quadrant free fluid that measures slightly higher than simple fluid. No intraperitoneal free gas. The  perinephric/arising from the pancreas and perisplenic/subdiaphragmatic fluid collections are likely contiguous with one another (6:36). Musculoskeletal: No chest wall abnormality. No suspicious lytic or blastic osseous lesions. No acute displaced fracture. IMPRESSION: 1. Left upper quadrant inflammatory changes with associated abscesses as below. 2. Chronic pancreatitis with underlying acute pancreatitis not excluded, recommend correlation with lipase levels. 3. A 7.5 x 7 cm perinephric loculated likely infected pseudocyst/abscess arising from the pancreatic tail leads to mass effect on the interpolar region of the left kidney. Similar finding along the subdiaphragmatic splenic capsule measuring up to 7 x 2 cm. The abscesses are likely contiguous with one another. 4. Gastric greater curvature irregular contour and possible thickened wall may represent reactive changes. 5. Trace volume left upper quadrant complex free fluid that could represent infected fluid versus blood products. 6. Left pleural effusion. Please see separately dictated CT angiography chest 01/24/2024. Electronically Signed   By: Tish Frederickson M.D.   On: 01/24/2024 14:29   CT Angio Chest PE W and/or Wo Contrast Result Date: 01/24/2024 CLINICAL DATA:  Acute left-sided chest pain and shortness of breath. EXAM: CT ANGIOGRAPHY CHEST WITH CONTRAST TECHNIQUE: Multidetector CT imaging of the chest was performed using the standard protocol during bolus administration of intravenous contrast. Multiplanar CT image reconstructions and MIPs were obtained to evaluate the vascular anatomy. RADIATION DOSE REDUCTION: This exam was performed according to the departmental dose-optimization program which includes automated exposure control, adjustment of the mA and/or kV according to patient size and/or use of iterative reconstruction technique. CONTRAST:  75mL OMNIPAQUE IOHEXOL 350 MG/ML SOLN COMPARISON:  09/04/2019 FINDINGS: Cardiovascular: Satisfactory  opacification of pulmonary arteries noted, and no pulmonary emboli identified. No evidence of thoracic aortic dissection or aneurysm. Mediastinum/Nodes: No masses or pathologically enlarged lymph nodes identified. Lungs/Pleura: Moderate left pleural effusion is seen with left lower lobe atelectasis. Upper abdomen: Several small perisplenic and left Peri renal fluid collections are noted, and are of uncertain etiology. Musculoskeletal: No suspicious bone lesions identified. Review of the MIP images confirms the above findings. IMPRESSION: No evidence of pulmonary embolism. Moderate left pleural effusion with left lower lobe atelectasis. Several small perisplenic and left perinephric fluid collections, of uncertain etiology. Consider further evaluation with abdomen and pelvis CT with contrast. Electronically Signed   By: Danae Orleans M.D.   On: 01/24/2024 11:59   DG Chest 2 View Result Date: 01/24/2024 CLINICAL DATA:  28 year old male with left chest  pain. EXAM: CHEST - 2 VIEW COMPARISON:  Chest radiographs 01/19/2022 and earlier. FINDINGS: AP and lateral views 0509 hours. Moderate opacification of the left lung base which appears at least in part related to pleural effusion. No air bronchograms are visible. Underlying mediastinal contours seem to remain normal. Visualized tracheal air column is within normal limits. No pulmonary edema. No pneumothorax. Right lung appears negative. No osseous abnormality identified.  Negative visible bowel gas. IMPRESSION: Opacified left lung base which is at least in part related to a left pleural effusion (perhaps moderate size). Underlying consolidation/pneumonia not excluded. Right lung appears negative. Electronically Signed   By: Odessa Fleming M.D.   On: 01/24/2024 05:43    ROS 10 point review of systems is negative except as listed above in HPI.   Physical Exam Blood pressure 112/70, pulse (!) 108, temperature 99.1 F (37.3 C), temperature source Oral, resp. rate 18, height  5\' 11"  (1.803 m), weight 68 kg, SpO2 97%. Constitutional: well-developed, well-nourished HEENT: pupils equal, round, reactive to light, 2mm b/l, moist conjunctiva, external inspection of ears and nose normal, hearing intact Oropharynx: normal oropharyngeal mucosa, normal dentition Neck: no thyromegaly, trachea midline, no midline cervical tenderness to palpation Chest: breath sounds equal bilaterally, normal respiratory effort, no midline or lateral chest wall tenderness to palpation/deformity Abdomen: soft, NT, no bruising, no hepatosplenomegaly Skin: warm, dry, no rashes Psych: normal memory, normal mood/affect     Assessment/Plan: Splenic abscess - recommend IR drain, cx, abx. No role for surgery at this point. Recommend w/u for etiology; consider bcx, echo to r/o seeding  FEN - strict NPO at midnight in anticipation of IR    Diamantina Monks, MD General and Trauma Surgery [facility]

## 2024-01-24 NOTE — ED Provider Notes (Signed)
 [place] EMERGENCY DEPARTMENT AT [facility] Provider Note   CSN: [id number] Arrival date & time: 01/24/24  7829     History  Chief Complaint  Patient presents with   Chest Pain    Joshua Morton is a 28 y.o. male with past medical history significant for GERD, hypertension, marijuana use, history of pancreatitis, alcohol use who presents with concern for left-sided chest pain that woke him up 2 hours ago, he reports sharp, 9/10, with some shortness of breath.  Patient does report that he was seen on 4/2 urgent care for a left torn rotator cuff after injury at work just a few days ago.  He denies any nausea, vomiting.  No previous history of blood clot.  Nothing for pain prior to arrival.  He reports the pain occurred in his sleep, woke him up out of sleep.  He denies worse with exertion.   Chest Pain      Home Medications Prior to Admission medications   Medication Sig Start Date End Date Taking? Authorizing Provider  acetaminophen (TYLENOL) 500 MG tablet Take 500 mg by mouth every 8 (eight) hours as needed. 01/16/22  Yes [provider]  cyclobenzaprine (FLEXERIL) 5 MG tablet Take 1-2 tablets (5-10 mg total) by mouth 3 (three) times daily as needed for muscle spasms. 01/21/24  Yes Brimage, Vondra, DO  meloxicam (MOBIC) 15 MG tablet Take 1 tablet (15 mg total) by mouth daily. 01/21/24  Yes Brimage, Seward Meth, DO      Allergies    Patient has no known allergies.    Review of Systems   Review of Systems  Cardiovascular:  Positive for chest pain.  All other systems reviewed and are negative.   Physical Exam Updated Vital Signs BP 124/88 (BP Location: Right Arm)   Pulse (!) 113   Temp 98.8 F (37.1 C) (Oral)   Resp (!) 23   Wt 68 kg   SpO2 98%   BMI 20.92 kg/m  Physical Exam Vitals and nursing note reviewed.  Constitutional:      General: He is not in acute distress.    Appearance: Normal appearance.  HENT:     Head: Normocephalic and atraumatic.   Eyes:     General:        Right eye: No discharge.        Left eye: No discharge.  Cardiovascular:     Rate and Rhythm: Normal rate and regular rhythm.     Heart sounds: No murmur heard.    No friction rub. No gallop.  Pulmonary:     Effort: Pulmonary effort is normal.     Breath sounds: Normal breath sounds.  Chest:     Comments: Focal tenderness to palpation of the left chest wall, but normal range of motion of bilateral upper extremities, normal strength 5/5 to flexion, extension at shoulder, elbow. Abdominal:     General: Bowel sounds are normal.     Palpations: Abdomen is soft.  Skin:    General: Skin is warm and dry.     Capillary Refill: Capillary refill takes less than 2 seconds.  Neurological:     Mental Status: He is alert and oriented to person, place, and time.  Psychiatric:        Mood and Affect: Mood normal.        Behavior: Behavior normal.     ED Results / Procedures / Treatments   Labs (all labs ordered are listed, but only abnormal results are displayed)  Labs Reviewed  BASIC METABOLIC PANEL WITH GFR - Abnormal; Notable for the following components:      Result Value   Glucose, Bld 129 (*)    Calcium 8.8 (*)    All other components within normal limits  CBC - Abnormal; Notable for the following components:   WBC 12.6 (*)    Platelets 414 (*)    All other components within normal limits  LIPASE, BLOOD - Abnormal; Notable for the following components:   Lipase 94 (*)    All other components within normal limits  HEPATIC FUNCTION PANEL - Abnormal; Notable for the following components:   Albumin 2.7 (*)    All other components within normal limits  D-DIMER, QUANTITATIVE - Abnormal; Notable for the following components:   D-Dimer, Quant 2.76 (*)    All other components within normal limits  TROPONIN I (HIGH SENSITIVITY)  TROPONIN I (HIGH SENSITIVITY)    EKG EKG Interpretation Date/Time:  Saturday January 24 2024 04:48:24 EDT Ventricular Rate:   112 PR Interval:  116 QRS Duration:  72 QT Interval:  312 QTC Calculation: 425 R Axis:   59  Text Interpretation: Sinus tachycardia Right atrial enlargement Borderline ECG When compared with ECG of 23-Jun-2021 11:33, PREVIOUS ECG IS PRESENT Confirmed by Zadie Rhine ([id number]) on 01/24/2024 5:17:50 AM  Radiology CT ABDOMEN PELVIS W CONTRAST Result Date: 01/24/2024 CLINICAL DATA:  Abdominal pain, acute, nonlocalized EXAM: CT ABDOMEN AND PELVIS WITH CONTRAST TECHNIQUE: Multidetector CT imaging of the abdomen and pelvis was performed using the standard protocol following bolus administration of intravenous contrast. RADIATION DOSE REDUCTION: This exam was performed according to the departmental dose-optimization program which includes automated exposure control, adjustment of the mA and/or kV according to patient size and/or use of iterative reconstruction technique. CONTRAST:  75mL OMNIPAQUE IOHEXOL 350 MG/ML SOLN COMPARISON:  CT angiography chest 01/24/2024, CT abdomen pelvis 03/15/2021 FINDINGS: Lower chest: Left pleural effusion. Please see separately dictated CT angiography chest 01/24/2024. Hepatobiliary: No focal liver abnormality. No gallstones, gallbladder wall thickening, or pericholecystic fluid. No biliary dilatation. Pancreas: Interval development of a lobulated peripherally enhancing perinephric fluid density lesion that is likely arising from the pancreatic tail and measuring up to 7.5 x 7 cm (best evaluated on coronal imaging, 6:25; 7:91)). Finding appears inseparable from the pancreatic tail and kidney. Coarse calcifications along the pancreatic tail may represent chronic pancreatitis. No main pancreatic ductal dilatation. Spleen: Spleen is normal in size. There is a subcapsular fluid density lesion along the spleen/left upper quadrant measuring up to 6.9 x 1.9 cm (6: 32-44). Adrenals/Urinary Tract: No adrenal nodule bilaterally. Bilateral kidneys enhance and excrete symmetrically. Mass  effect on the left interpolar region of the kidney from perinephric fluid collection as described in the pancreatic section. No hydronephrosis. No hydroureter.  No nephroureterolithiasis. The urinary bladder is unremarkable. Stomach/Bowel: Question greater curvature of the stomach bowel wall thickening and hazy contour (6:52). No evidence of bowel wall thickening or dilatation. Appendix appears normal. Vascular/Lymphatic: No abdominal aorta or iliac aneurysm. Mild atherosclerotic plaque of the aorta and its branches. No abdominal, pelvic, or inguinal lymphadenopathy. Reproductive: Prostate is unremarkable. Other: Trace volume left upper quadrant free fluid that measures slightly higher than simple fluid. No intraperitoneal free gas. The perinephric/arising from the pancreas and perisplenic/subdiaphragmatic fluid collections are likely contiguous with one another (6:36). Musculoskeletal: No chest wall abnormality. No suspicious lytic or blastic osseous lesions. No acute displaced fracture. IMPRESSION: 1. Left upper quadrant inflammatory changes with associated abscesses as below. 2. Chronic pancreatitis  with underlying acute pancreatitis not excluded, recommend correlation with lipase levels. 3. A 7.5 x 7 cm perinephric loculated likely infected pseudocyst/abscess arising from the pancreatic tail leads to mass effect on the interpolar region of the left kidney. Similar finding along the subdiaphragmatic splenic capsule measuring up to 7 x 2 cm. The abscesses are likely contiguous with one another. 4. Gastric greater curvature irregular contour and possible thickened wall may represent reactive changes. 5. Trace volume left upper quadrant complex free fluid that could represent infected fluid versus blood products. 6. Left pleural effusion. Please see separately dictated CT angiography chest 01/24/2024. Electronically Signed   By: Tish Frederickson M.D.   On: 01/24/2024 14:29   CT Angio Chest PE W and/or Wo  Contrast Result Date: 01/24/2024 CLINICAL DATA:  Acute left-sided chest pain and shortness of breath. EXAM: CT ANGIOGRAPHY CHEST WITH CONTRAST TECHNIQUE: Multidetector CT imaging of the chest was performed using the standard protocol during bolus administration of intravenous contrast. Multiplanar CT image reconstructions and MIPs were obtained to evaluate the vascular anatomy. RADIATION DOSE REDUCTION: This exam was performed according to the departmental dose-optimization program which includes automated exposure control, adjustment of the mA and/or kV according to patient size and/or use of iterative reconstruction technique. CONTRAST:  75mL OMNIPAQUE IOHEXOL 350 MG/ML SOLN COMPARISON:  09/04/2019 FINDINGS: Cardiovascular: Satisfactory opacification of pulmonary arteries noted, and no pulmonary emboli identified. No evidence of thoracic aortic dissection or aneurysm. Mediastinum/Nodes: No masses or pathologically enlarged lymph nodes identified. Lungs/Pleura: Moderate left pleural effusion is seen with left lower lobe atelectasis. Upper abdomen: Several small perisplenic and left Peri renal fluid collections are noted, and are of uncertain etiology. Musculoskeletal: No suspicious bone lesions identified. Review of the MIP images confirms the above findings. IMPRESSION: No evidence of pulmonary embolism. Moderate left pleural effusion with left lower lobe atelectasis. Several small perisplenic and left perinephric fluid collections, of uncertain etiology. Consider further evaluation with abdomen and pelvis CT with contrast. Electronically Signed   By: Danae Orleans M.D.   On: 01/24/2024 11:59   DG Chest 2 View Result Date: 01/24/2024 CLINICAL DATA:  28 year old male with left chest pain. EXAM: CHEST - 2 VIEW COMPARISON:  Chest radiographs 01/19/2022 and earlier. FINDINGS: AP and lateral views 0509 hours. Moderate opacification of the left lung base which appears at least in part related to pleural effusion. No  air bronchograms are visible. Underlying mediastinal contours seem to remain normal. Visualized tracheal air column is within normal limits. No pulmonary edema. No pneumothorax. Right lung appears negative. No osseous abnormality identified.  Negative visible bowel gas. IMPRESSION: Opacified left lung base which is at least in part related to a left pleural effusion (perhaps moderate size). Underlying consolidation/pneumonia not excluded. Right lung appears negative. Electronically Signed   By: Odessa Fleming M.D.   On: 01/24/2024 05:43    Procedures Procedures    Medications Ordered in ED Medications  piperacillin-tazobactam (ZOSYN) IVPB 3.375 g (has no administration in time range)  morphine (PF) 4 MG/ML injection 4 mg (4 mg Intravenous Given 01/24/24 0703)  ketorolac (TORADOL) 15 MG/ML injection 15 mg (15 mg Intravenous Given 01/24/24 0702)  sodium chloride 0.9 % bolus 1,000 mL (0 mLs Intravenous Stopped 01/24/24 0858)  morphine (PF) 4 MG/ML injection 4 mg (4 mg Intravenous Given 01/24/24 0906)  iohexol (OMNIPAQUE) 350 MG/ML injection 75 mL (75 mLs Intravenous Contrast Given 01/24/24 1108)  morphine (PF) 4 MG/ML injection 4 mg (4 mg Intravenous Given 01/24/24 1251)  iohexol (OMNIPAQUE) 350  MG/ML injection 75 mL (75 mLs Intravenous Contrast Given 01/24/24 1353)    ED Course/ Medical Decision Making/ A&P                                 Medical Decision Making Amount and/or Complexity of Data Reviewed Labs: ordered. Radiology: ordered.  Risk Prescription drug management.    This patient is a 28 y.o. male  who presents to the ED for concern of chest pain.   Differential diagnoses prior to evaluation: The emergent differential diagnosis includes, but is not limited to,  ACS, AAS, PE, Mallory-Weiss, Boerhaave's, Pneumonia, acute bronchitis, asthma or COPD exacerbation, anxiety, MSK pain or traumatic injury to the chest, acid reflux versus other . This is not an exhaustive differential.   Past Medical  History / Co-morbidities / Social History: GERD, hypertension, marijuana use, history of pancreatitis, alcohol use   Additional history: Chart reviewed. Pertinent results include: Reviewed evaluation at urgent care just a few days ago with diagnosis of possible rotator cuff tear on the left after heavy use of left arm secondary to his job  Physical Exam: Physical exam performed. The pertinent findings include: I am able to reproduce the sensation with palpation of his chest wall.  He does have a mild tachycardia, pulse of 109 which is improved after pain control.  Lab Tests/Imaging studies: I personally interpreted labs/imaging and the pertinent results include: CBC with mild leukocytosis, blood cells 12.6, BMP overall unremarkable, initial troponin is normal at 6, delta troponin 10, normal hepatic function panel, lipase 94, mildly elevated.  I independently interpreted plain film chest x-ray which shows no evidence of acute intrathoracic abnormality.. Grossly abnormal CT notable for  No evidence of pulmonary embolism.    Moderate left pleural effusion with left lower lobe atelectasis.    Several small perisplenic and left perinephric fluid collections, of  uncertain etiology. Consider further evaluation with abdomen and  pelvis CT with contrast.   CT abdomen pelvis with contrast shows  1. Left upper quadrant inflammatory changes with associated  abscesses as below.  2. Chronic pancreatitis with underlying acute pancreatitis not  excluded, recommend correlation with lipase levels.  3. A 7.5 x 7 cm perinephric loculated likely infected  pseudocyst/abscess arising from the pancreatic tail leads to mass  effect on the interpolar region of the left kidney. Similar finding  along the subdiaphragmatic splenic capsule measuring up to 7 x 2 cm.  The abscesses are likely contiguous with one another.  4. Gastric greater curvature irregular contour and possible  thickened wall may represent  reactive changes.  5. Trace volume left upper quadrant complex free fluid that could  represent infected fluid versus blood products.  6. Left pleural effusion. Please see separately dictated CT  angiography chest 01/24/2024.   I agree with the radiologist interpretation.  Cardiac monitoring: EKG obtained and interpreted by myself and attending physician which shows: sinus tachycardia, no acute st-t changes noted   Medications: I ordered medication including morphine, toradol, fluid bolus.  I have reviewed the patients home medicines and have made adjustments as needed.  Given infection noted on CT scan, concerning abscess started patient on Zosyn   Consults: I spoke with on-call surgeon Dr. Bedelia Person who is in the middle of a current surgery, she will plan to review the images and call back, patient currently stable in the emergency department, we will currently await her consults and discuss further planning management  from there.  3:03 PM Care of Joshua Morton transferred to PA Langston Masker and Dr. Eloise Harman at the end of my shift as the patient will require reassessment once labs/imaging have resulted. Patient presentation, ED course, and plan of care discussed with review of all pertinent labs and imaging. Please see his/her note for further details regarding further ED course and disposition. Plan at time of handoff is will need admission after consultation with surgery versus IR for large intra-abdominal abscess with left-sided pleural effusion. This may be altered or completely changed at the discretion of the oncoming team pending results of further workup.  Final Clinical Impression(s) / ED Diagnoses Final diagnoses:  None    Rx / DC Orders ED Discharge Orders     None         [place] 01/24/24 1503    Zadie Rhine, MD 01/25/24 [phone]

## 2024-01-24 NOTE — ED Provider Notes (Signed)
 Patient is a 28 year old who has a past medical history of alcohol abuse and pancreatitis.  Patient reports having left-sided chest pain beginning yesterday.  Patient was evaluated and had an elevated D-dimer.  CT chest was obtained which shows a left pleural effusion as well as a 7 cm pseudocyst.  Dr. Bedelia Person general surgery was consulted.  Dr. Bonita Quin advised patient does not need surgical intervention however she advised consult to IR.  She advised empiric antibiotics until culture returns.  I consulted hospitalist who will see the for admission.   Elson Areas, [place] 01/24/24 1642    Rondel Baton, MD 01/25/24 [phone]

## 2024-01-24 NOTE — Progress Notes (Signed)
 IR received request to assess for  intra abdominal abscess drain placemnt.   28 y.o. male. History of HTN, GERD alcohol use and pancreatitis. Presented to the ED at [facility] on 4.5.25 with left sided chest pain. Found to have an elevated d -dimer, a pleural effusion and intra abdominal abscesses. CT Abd pelvis from 4.5.25 reads A 7.5 x 7 cm perinephric loculated likely infected pseudocyst/abscess arising from the pancreatic tail leads to mass effect on the interpolar region of the left kidney. Similar finding along the subdiaphragmatic splenic capsule measuring up to 7 x 2 cm. WBC is 12.6. albumin 2.7, d- dimer 2.76.   As procedure request is not emergent the Case will  be reviewed with IR Attending in the am. Patient was made NPO at midnight  in the event procedure approved by IR Attending.   Should patient condition change and emergent procedure is warranted please contact IR MD

## 2024-01-24 NOTE — Progress Notes (Signed)
 Pharmacy Antibiotic Note  Joshua Morton is a 28 y.o. male admitted on 01/24/2024 presenting with intra-abdominal abscess.  Pharmacy has been consulted for zosyn dosing.  Plan: Zosyn 3.375g IV q 8h (extended 4h infusion) Monitor renal function, Cx and clinical progression to narrow  Weight: 68 kg (150 lb)  Temp (24hrs), Avg:98.8 F (37.1 C), Min:98.4 F (36.9 C), Max:99.8 F (37.7 C)  Recent Labs  Lab 01/24/24 0501  WBC 12.6*  CREATININE 0.91    Estimated Creatinine Clearance: 115.2 mL/min (by C-G formula based on SCr of 0.91 mg/dL).    No Known Allergies  Daylene Posey, PharmD, [facility] Clinical Pharmacist ED Pharmacist Phone # [phone] 01/24/2024 6:08 PM

## 2024-01-24 NOTE — H&P (Cosign Needed Addendum)
 Date: 01/24/2024               Patient Name:  Joshua Morton MRN: [id number]  DOB: [birth year] Age / Sex: 28 y.o., male   PCP: Patient, No Pcp Per         Medical Service: Internal Medicine Teaching Service         Attending Physician: Dr. Ninetta Lights, Lacretia Leigh, MD      First Contact: Dr. Jeral Pinch, DO Pager [phone]    Second Contact: Dr. Modena Slater, DO          After Hours (After 5p/  First Contact Pager: [phone]  weekends / holidays): Second Contact Pager: [phone]   SUBJECTIVE   Chief Complaint: Chest pain   History of Present Illness:   This is a 28 year old male with significant history of alcohol use, cannabis use, multiple pancreatitis/alcoholic pancreatitis,  s/p GJ tube placement 12/2021 for pseudocyst, had it replaced on 01/21/2022, presents today for chest pain admitted for IR consult drainage/abscess.   Patient reports symptoms started roughly at 02:30 this morning with acute left sided/epigastric chest pain, consistent, sharp like, unrelieved from Robaxin or meloxicam. Tried to take a shower and it did not get better. He denies any numbness or tingling of extremities. No nausea or vomiting. No diarrhea. He reports having night sweats that has been going on for about 2-3 weeks. He denies any recent cough congestion or sore throat. No leg pain, no bloody vomits, or bloody stools. No weight loss.  He does have a left shoulder injury about a month ago, visited [facility] 01/21/2024, was prescribed Meloxicam and Robaxin.   ED Course: General/trauma Surgery consulted by EDP  Past Medical History: Past Medical History:  Diagnosis Date   Acute pancreatitis 09/04/2019   Acute respiratory failure with hypoxia (HCC)    Attempted suicide (HCC)    GERD (gastroesophageal reflux disease) 09/04/2019   Pancreatitis, alcoholic, acute 09/04/2019    Meds:  Current Outpatient Medications  Medication Instructions   acetaminophen (TYLENOL) 500 mg, Oral, Every 8 hours PRN    cyclobenzaprine (FLEXERIL) 5-10 mg, Oral, 3 times daily PRN   meloxicam (MOBIC) 15 mg, Oral, Daily    Past Surgical History:  Procedure Laterality Date   IR GASTR TUBE CONVERT GASTR-JEJ PER W/FL MOD SED      Social:  Lives in [place] Level of function: Independent ADLs and iADLS  Employed: [employer]  Substances: No alcohol in 3+ years. Has been vaping for the past 3 years.  PCP: Patient, No Pcp Per   Family History: noncontributory  Allergies: Allergies as of 01/24/2024   (No Known Allergies)    Review of Systems: A complete ROS was negative except as per HPI.   OBJECTIVE:   Physical Exam: Blood pressure 121/78, pulse (!) 103, temperature 98.8 F (37.1 C), temperature source Oral, resp. rate 20, weight 68 kg, SpO2 98%.   Constitutional: Sitting up in bed, no acute distress  HENT: normocephalic atraumatic, mucous membranes moist Eyes: conjunctiva non-erythematous Cardiovascular: tachycardiac  Pulmonary/Chest: normal work of breathing on room air, lungs clear to auscultation bilaterally Abdominal: +Epigastric tenderness  MSK: normal bulk and tone Neurological: Awake, answering questions appropriately  Skin: warm and dry  Labs: CBC    Component Value Date/Time   WBC 12.6 (H) 01/24/2024 0501   RBC 4.49 01/24/2024 0501   HGB 14.5 01/24/2024 0501   HCT 43.6 01/24/2024 0501   PLT 414 (H) 01/24/2024 0501   MCV 97.1 01/24/2024 0501  MCH 32.3 01/24/2024 0501   MCHC 33.3 01/24/2024 0501   RDW 12.7 01/24/2024 0501   LYMPHSABS 0.9 09/05/2019 0554   MONOABS 0.8 09/05/2019 0554   EOSABS 0.5 09/05/2019 0554   BASOSABS 0.0 09/05/2019 0554     CMP     Component Value Date/Time   NA 139 01/24/2024 0501   K 3.9 01/24/2024 0501   CL 103 01/24/2024 0501   CO2 28 01/24/2024 0501   GLUCOSE 129 (H) 01/24/2024 0501   BUN 16 01/24/2024 0501   CREATININE 0.91 01/24/2024 0501   CALCIUM 8.8 (L) 01/24/2024 0501   PROT 6.7 01/24/2024 0644   ALBUMIN 2.7 (L)  01/24/2024 0644   AST 38 01/24/2024 0644   ALT 38 01/24/2024 0644   ALKPHOS 62 01/24/2024 0644   BILITOT 0.6 01/24/2024 0644   GFRNONAA >60 01/24/2024 0501   GFRAA >60 09/08/2019 0440    Imaging:  DG chest 2 view Opacified left lung base which is at least in part related to a left pleural effusion (perhaps moderate size). Underlying consolidation/pneumonia not excluded. Right lung appears negative.  CT angio chest PE with/without contrast IMPRESSION: No evidence of pulmonary embolism. Moderate left pleural effusion with left lower lobe atelectasis. Several small perisplenic and left perinephric fluid collections, of uncertain etiology. Consider further evaluation with abdomen and pelvis CT with contrast.  CT abdomen pelvis with contrast IMPRESSION: 1. Left upper quadrant inflammatory changes with associated abscesses as below. 2. Chronic pancreatitis with underlying acute pancreatitis not excluded, recommend correlation with lipase levels. 3. A 7.5 x 7 cm perinephric loculated likely infected pseudocyst/abscess arising from the pancreatic tail leads to mass effect on the interpolar region of the left kidney. Similar finding along the subdiaphragmatic splenic capsule measuring up to 7 x 2 cm. The abscesses are likely contiguous with one another. 4. Gastric greater curvature irregular contour and possible thickened wall may represent reactive changes. 5. Trace volume left upper quadrant complex free fluid that could represent infected fluid versus blood products. 6. Left pleural effusion. Please see separately dictated CT angiography chest 01/24/2024.  EKG: personally reviewed my interpretation is sinus tachycardia with ventricular rate of 112, QTc 425 ms.  ASSESSMENT & PLAN:   Assessment & Plan by Problem: Principal Problem:   Acute on chronic pancreatitis (HCC) Active Problems:   GERD (gastroesophageal reflux disease)   Pseudocyst of pancreas   Joshua Morton is a 28 y.o. male with significant history of previous alcohol use, multiple pancreatitis/alcoholic pancreatitis, s/p GJ tube placement 12/2021 for pseudocyst, had it replaced on 01/21/2022, presents today for chest pain admitted for loculated pseudocyst/abscess.   Pseudocysts/abscesses pancreatic tail and splenic capsule  Acute on chronic pancreatitis    Leukocytosis  History of multiple pancreatitis, last admission 01/02/2022 was found to have pseudocyst with duodenal narrowing due to adjacent inflammation without fistula formation, s/p GJ tube placed 03/20 for 90 days. Reports quitting drinking for the past 3 years, presents today for acute epigastric/left chest pain. CT a/p findings with  7.5 x 7 cm perinephric loculated likely infected pseudocyst/abscess arising from the pancreatic tail leads to mass effect on the interpolar region of the left kidney. Similar finding along the subdiaphragmatic splenic capsule measuring up to 7 x 2 cm. Has reactive changes noted along the greater curvature of gastric wall. Has complex free fluid noted in the LUQ of the abdomen. Of note, CT a/p 02/2022 had shown regression of the cystic lesion in the neck of the pancreas and focal atrophy of  the tail. Labs pertinent for WBC 12.6, Lipase 94, hepatic function panel WNL.  General Surgery on board, appreciate there recommendations. S/p 1 L NS and IV zosyn.  - Follow up on Trig panel - Start LR 150 mL/ hr for 24 hours - Start Zosyn 3.375 g for intra-abdominal infection (pseudocyst/abscess) - PRN IV Dilaudid 0.5-1 mg q2h for severe pain  - PRN PO Tylenol 650 mg q6h  - PRN PO oxycodone 5 mg q3h for moderate pain  - NPO at midnight for IR guided drain 01/25/2024        Moderate Left Pleural effusion  Chest pain, MSK likely Noted on CT a/p, no shortness of breath, On room air, saturating > 94 %. D dimer elevated 2.76, likely in the setting of above. CTA negative for PE. Trop 6 -> 7; Will hold diuresis to allow optimize  perfusion. CP likely due to MSK/ referred visceral pain.  - Pain management as per above - Lidocaine patches  Hx of Alcohol Use Reports he quit drinking about 3 years ago, since last hospitalization 12/2021. Denies any current alcohol use.   Diet:  Advance as patient tolerates  VTE: Heparin IVF: LR, 150 cc for 24 hours Code: Full  Prior to Admission Living Arrangement: Home, living self  Anticipated Discharge Location: Home Barriers to Discharge: Medical management   Dispo: Admit patient to Inpatient with expected length of stay greater than 2 midnights.  Signed: Jeral Pinch, DO Internal Medicine Resident PGY-1  01/24/2024, 5:50 PM

## 2024-01-24 NOTE — Hospital Course (Addendum)
  Joshua Morton is a 28 y.o. male with significant history of previous alcohol use, multiple pancreatitis/alcoholic pancreatitis, s/p GJ tube placement 12/2021 for pseudocyst, had it replaced on 01/21/2022, presents today for chest pain admitted for loculated pseudocyst/abscess.    Pseudocysts/abscesses pancreatic tail and splenic capsule  Acute on chronic pancreatitis    Leukocytosis  Initially presented with chest pain and epigastric pain.  Afebrile, mild leukocytosis, mild elevation in lipase.  CT A/P showed perisplenic abscess and pancreatic pseudocyst tracking near the left kidney.  Evidence of chronic calcific pancreatitis, without pancreatic ductal dilatation.  We consulted IR, general surgery and GI. Interventional radiology will not drain fluid, as it does not look infected, based on imaging. General surgery will not drain the pancreatic pseudocyst, as that will likely lead to fistula formatio GI not recommending endoscopic sampling of the fluid, as his leukocytosis has resolved, and he has continued to be afebrile.  Would recommend repeat imaging in 4 weeks to see if there is any interval change that might require evaluation with EUS  Tolerating soft diet, mild nausea but no vomiting. His abdominal pain much improved with antibiotics and appropriate pain regimen.  Will need to complete 2 more days of p.o. Cipro/Flagyl outpatient.  Currently, he does not have a primary care doctor would like to follow with [facility].  - Ciprofloxacin 500 mg twice daily for 2 days  - Flagyl 500 mg twice daily for 2 days. - CT follow up in 4 weeks - Oxycodone 10 mg Q6hrs for 5 days. - Tylenol 1000 mg TID. - Zofran 4 mg PRN      Moderate Left Pleural effusion  Chest pain, MSK likely Noted on CT a/p, no shortness of breath, On room air, saturating > 94 %. D dimer elevated 2.76, likely in the setting of above. CTA negative for PE.  CP likely due to MSK/ referred visceral pain,  -  Pain management as per above - Lidocaine patches   Hx of Alcohol Use - No Alcohol use for about 3 years.

## 2024-01-24 NOTE — Progress Notes (Signed)
 CT scan reviewed. Recommend IR consultation for aspiration and drainage of fluid collections. Recommend empiric abx and await culture data. No surgery indicated at this time. Full consult to follow.   Diamantina Monks, MD General and Trauma Surgery [facility]

## 2024-01-24 NOTE — ED Triage Notes (Signed)
 Pt in with L sided chest pain that woke him up 2hrs ago. Pt gripping chest and unable to take deep breaths due to pain, +sob. Was seen on 4/2 at [facility] for L torn rotator cuff after injury at work.

## 2024-01-25 DIAGNOSIS — K861 Other chronic pancreatitis: Secondary | ICD-10-CM | POA: Diagnosis not present

## 2024-01-25 DIAGNOSIS — K219 Gastro-esophageal reflux disease without esophagitis: Secondary | ICD-10-CM | POA: Diagnosis present

## 2024-01-25 DIAGNOSIS — Z791 Long term (current) use of non-steroidal anti-inflammatories (NSAID): Secondary | ICD-10-CM | POA: Diagnosis not present

## 2024-01-25 DIAGNOSIS — J90 Pleural effusion, not elsewhere classified: Secondary | ICD-10-CM | POA: Diagnosis present

## 2024-01-25 DIAGNOSIS — K859 Acute pancreatitis without necrosis or infection, unspecified: Secondary | ICD-10-CM | POA: Diagnosis present

## 2024-01-25 DIAGNOSIS — K863 Pseudocyst of pancreas: Secondary | ICD-10-CM | POA: Diagnosis present

## 2024-01-25 DIAGNOSIS — Z8249 Family history of ischemic heart disease and other diseases of the circulatory system: Secondary | ICD-10-CM | POA: Diagnosis not present

## 2024-01-25 DIAGNOSIS — I38 Endocarditis, valve unspecified: Secondary | ICD-10-CM | POA: Diagnosis not present

## 2024-01-25 DIAGNOSIS — I10 Essential (primary) hypertension: Secondary | ICD-10-CM | POA: Diagnosis present

## 2024-01-25 DIAGNOSIS — Z9151 Personal history of suicidal behavior: Secondary | ICD-10-CM | POA: Diagnosis not present

## 2024-01-25 DIAGNOSIS — D733 Abscess of spleen: Secondary | ICD-10-CM | POA: Diagnosis present

## 2024-01-25 DIAGNOSIS — K8689 Other specified diseases of pancreas: Secondary | ICD-10-CM | POA: Diagnosis not present

## 2024-01-25 DIAGNOSIS — F1090 Alcohol use, unspecified, uncomplicated: Secondary | ICD-10-CM | POA: Diagnosis not present

## 2024-01-25 DIAGNOSIS — E8809 Other disorders of plasma-protein metabolism, not elsewhere classified: Secondary | ICD-10-CM | POA: Diagnosis present

## 2024-01-25 DIAGNOSIS — F17290 Nicotine dependence, other tobacco product, uncomplicated: Secondary | ICD-10-CM | POA: Diagnosis present

## 2024-01-25 LAB — CBC
HCT: 38.2 % — ABNORMAL LOW (ref 39.0–52.0)
Hemoglobin: 12.8 g/dL — ABNORMAL LOW (ref 13.0–17.0)
MCH: 32.5 pg (ref 26.0–34.0)
MCHC: 33.5 g/dL (ref 30.0–36.0)
MCV: 97 fL (ref 80.0–100.0)
Platelets: 311 10*3/uL (ref 150–400)
RBC: 3.94 MIL/uL — ABNORMAL LOW (ref 4.22–5.81)
RDW: 12.9 % (ref 11.5–15.5)
WBC: 12.1 10*3/uL — ABNORMAL HIGH (ref 4.0–10.5)
nRBC: 0 % (ref 0.0–0.2)

## 2024-01-25 LAB — HEPATITIS PANEL, ACUTE
HCV Ab: NONREACTIVE
Hep A IgM: NONREACTIVE
Hep B C IgM: NONREACTIVE
Hepatitis B Surface Ag: NONREACTIVE

## 2024-01-25 LAB — COMPREHENSIVE METABOLIC PANEL WITH GFR
ALT: 25 U/L (ref 0–44)
AST: 14 U/L — ABNORMAL LOW (ref 15–41)
Albumin: 2.2 g/dL — ABNORMAL LOW (ref 3.5–5.0)
Alkaline Phosphatase: 47 U/L (ref 38–126)
Anion gap: 9 (ref 5–15)
BUN: 12 mg/dL (ref 6–20)
CO2: 28 mmol/L (ref 22–32)
Calcium: 8.4 mg/dL — ABNORMAL LOW (ref 8.9–10.3)
Chloride: 98 mmol/L (ref 98–111)
Creatinine, Ser: 0.89 mg/dL (ref 0.61–1.24)
GFR, Estimated: >60 mL/min (ref >60)
Glucose, Bld: 89 mg/dL (ref 70–99)
Potassium: 4.4 mmol/L (ref 3.5–5.1)
Sodium: 135 mmol/L (ref 135–145)
Total Bilirubin: 0.9 mg/dL (ref 0.0–1.2)
Total Protein: 5.4 g/dL — ABNORMAL LOW (ref 6.5–8.1)

## 2024-01-25 NOTE — Progress Notes (Signed)
 HD#0 Subjective:   Summary: Joshua Morton is a 28 y.o. male with PMH of previous alcohol use, multiple pancreatitis/alcoholic pancreatitis, s/p GJ tube placement 12/2021 for pseudocyst, had it replaced on 01/21/2022, presents today for chest pain admitted for loculated pseudocyst/abscess.   Overnight Events: None  Interim history: Reports LUQ abdominal pain worsened with cough, no fevers overnight. No nausea or vomiting.   Objective:  Vital signs in last 24 hours: Vitals:   01/24/24 1846 01/24/24 1951 01/25/24 0040 01/25/24 0527  BP:  112/70 120/70 106/69  Pulse:  (!) 108 (!) 102 (!) 101  Resp:      Temp:  99.1 F (37.3 C) 98.6 F (37 C) 98.6 F (37 C)  TempSrc:  Oral Oral Oral  SpO2:  97% 97% 91%  Weight:      Height: 5\' 11"  (1.803 m)      Supplemental O2: Room Air SpO2: 91 %   Physical Exam:  Constitutional: Laying in bed, no acute distress HENT: normocephalic atraumatic, mucous membranes moist Eyes: conjunctiva non-erythematous Neck: supple Cardiovascular: tachycardiac  Pulmonary/Chest: normal work of breathing on room air, lungs clear to auscultation bilaterally Abdominal: +LUQ/ epigastric tenderness  MSK: normal bulk and tone Neurological: Awake, Alert, answering questions appropriately  Skin: warm and dry  Filed Weights   01/24/24 0455  Weight: 68 kg     Intake/Output Summary (Last 24 hours) at 01/25/2024 0700 Last data filed at 01/25/2024 0300 Gross per 24 hour  Intake 2480.64 ml  Output --  Net 2480.64 ml   Net IO Since Admission: 2,480.64 mL [01/25/24 0700]  Pertinent Labs:    Latest Ref Rng & Units 01/25/2024    4:59 AM 01/24/2024    5:01 AM 01/19/2022   11:04 AM  CBC  WBC 4.0 - 10.5 K/uL 12.1  12.6  5.5   Hemoglobin 13.0 - 17.0 g/dL 16.1  09.6  04.5   Hematocrit 39.0 - 52.0 % 38.2  43.6  41.1   Platelets 150 - 400 K/uL 311  414  227        Latest Ref Rng & Units 01/25/2024    4:59 AM 01/24/2024    6:44 AM 01/24/2024    5:01 AM  CMP   Glucose 70 - 99 mg/dL 89   409   BUN 6 - 20 mg/dL 12   16   Creatinine 8.11 - 1.24 mg/dL 9.14   7.82   Sodium 956 - 145 mmol/L 135   139   Potassium 3.5 - 5.1 mmol/L 4.4   3.9   Chloride 98 - 111 mmol/L 98   103   CO2 22 - 32 mmol/L 28   28   Calcium 8.9 - 10.3 mg/dL 8.4   8.8   Total Protein 6.5 - 8.1 g/dL 5.4  6.7    Total Bilirubin 0.0 - 1.2 mg/dL 0.9  0.6    Alkaline Phos 38 - 126 U/L 47  62    AST 15 - 41 U/L 14  38    ALT 0 - 44 U/L 25  38      Imaging: CT ABDOMEN PELVIS W CONTRAST Result Date: 01/24/2024 CLINICAL DATA:  Abdominal pain, acute, nonlocalized EXAM: CT ABDOMEN AND PELVIS WITH CONTRAST TECHNIQUE: Multidetector CT imaging of the abdomen and pelvis was performed using the standard protocol following bolus administration of intravenous contrast. RADIATION DOSE REDUCTION: This exam was performed according to the departmental dose-optimization program which includes automated exposure control, adjustment of the mA and/or  kV according to patient size and/or use of iterative reconstruction technique. CONTRAST:  75mL OMNIPAQUE IOHEXOL 350 MG/ML SOLN COMPARISON:  CT angiography chest 01/24/2024, CT abdomen pelvis 03/15/2021 FINDINGS: Lower chest: Left pleural effusion. Please see separately dictated CT angiography chest 01/24/2024. Hepatobiliary: No focal liver abnormality. No gallstones, gallbladder wall thickening, or pericholecystic fluid. No biliary dilatation. Pancreas: Interval development of a lobulated peripherally enhancing perinephric fluid density lesion that is likely arising from the pancreatic tail and measuring up to 7.5 x 7 cm (best evaluated on coronal imaging, 6:25; 7:91)). Finding appears inseparable from the pancreatic tail and kidney. Coarse calcifications along the pancreatic tail may represent chronic pancreatitis. No main pancreatic ductal dilatation. Spleen: Spleen is normal in size. There is a subcapsular fluid density lesion along the spleen/left upper quadrant  measuring up to 6.9 x 1.9 cm (6: 32-44). Adrenals/Urinary Tract: No adrenal nodule bilaterally. Bilateral kidneys enhance and excrete symmetrically. Mass effect on the left interpolar region of the kidney from perinephric fluid collection as described in the pancreatic section. No hydronephrosis. No hydroureter.  No nephroureterolithiasis. The urinary bladder is unremarkable. Stomach/Bowel: Question greater curvature of the stomach bowel wall thickening and hazy contour (6:52). No evidence of bowel wall thickening or dilatation. Appendix appears normal. Vascular/Lymphatic: No abdominal aorta or iliac aneurysm. Mild atherosclerotic plaque of the aorta and its branches. No abdominal, pelvic, or inguinal lymphadenopathy. Reproductive: Prostate is unremarkable. Other: Trace volume left upper quadrant free fluid that measures slightly higher than simple fluid. No intraperitoneal free gas. The perinephric/arising from the pancreas and perisplenic/subdiaphragmatic fluid collections are likely contiguous with one another (6:36). Musculoskeletal: No chest wall abnormality. No suspicious lytic or blastic osseous lesions. No acute displaced fracture. IMPRESSION: 1. Left upper quadrant inflammatory changes with associated abscesses as below. 2. Chronic pancreatitis with underlying acute pancreatitis not excluded, recommend correlation with lipase levels. 3. A 7.5 x 7 cm perinephric loculated likely infected pseudocyst/abscess arising from the pancreatic tail leads to mass effect on the interpolar region of the left kidney. Similar finding along the subdiaphragmatic splenic capsule measuring up to 7 x 2 cm. The abscesses are likely contiguous with one another. 4. Gastric greater curvature irregular contour and possible thickened wall may represent reactive changes. 5. Trace volume left upper quadrant complex free fluid that could represent infected fluid versus blood products. 6. Left pleural effusion. Please see separately  dictated CT angiography chest 01/24/2024. Electronically Signed   By: Tish Frederickson M.D.   On: 01/24/2024 14:29   CT Angio Chest PE W and/or Wo Contrast Result Date: 01/24/2024 CLINICAL DATA:  Acute left-sided chest pain and shortness of breath. EXAM: CT ANGIOGRAPHY CHEST WITH CONTRAST TECHNIQUE: Multidetector CT imaging of the chest was performed using the standard protocol during bolus administration of intravenous contrast. Multiplanar CT image reconstructions and MIPs were obtained to evaluate the vascular anatomy. RADIATION DOSE REDUCTION: This exam was performed according to the departmental dose-optimization program which includes automated exposure control, adjustment of the mA and/or kV according to patient size and/or use of iterative reconstruction technique. CONTRAST:  75mL OMNIPAQUE IOHEXOL 350 MG/ML SOLN COMPARISON:  09/04/2019 FINDINGS: Cardiovascular: Satisfactory opacification of pulmonary arteries noted, and no pulmonary emboli identified. No evidence of thoracic aortic dissection or aneurysm. Mediastinum/Nodes: No masses or pathologically enlarged lymph nodes identified. Lungs/Pleura: Moderate left pleural effusion is seen with left lower lobe atelectasis. Upper abdomen: Several small perisplenic and left Peri renal fluid collections are noted, and are of uncertain etiology. Musculoskeletal: No suspicious bone lesions identified. Review  of the MIP images confirms the above findings. IMPRESSION: No evidence of pulmonary embolism. Moderate left pleural effusion with left lower lobe atelectasis. Several small perisplenic and left perinephric fluid collections, of uncertain etiology. Consider further evaluation with abdomen and pelvis CT with contrast. Electronically Signed   By: Danae Orleans M.D.   On: 01/24/2024 11:59    Assessment/Plan:   Principal Problem:   Acute on chronic pancreatitis (HCC) Active Problems:   GERD (gastroesophageal reflux disease)   Pseudocyst of  pancreas   Patient Summary: Joshua Morton is a 28 y.o. male with significant history of previous alcohol use, multiple pancreatitis/alcoholic pancreatitis, s/p GJ tube placement 12/2021 for pseudocyst, had it replaced on 01/21/2022, presents today for chest pain admitted for loculated pseudocyst/abscess.    Pseudocysts/abscesses pancreatic tail and splenic capsule  Acute on chronic pancreatitis    Leukocytosis  History of multiple pancreatitis, last admission 01/02/2022 was found to have pseudocyst with duodenal narrowing due to adjacent inflammation without fistula formation, s/p GJ tube placed 03/20 for 90 days. Reports quitting drinking for the past 3 years, presents today for acute epigastric/left chest pain. CT a/p findings with  7.5 x 7 cm perinephric loculated likely infected pseudocyst/abscess arising from the pancreatic tail leads to mass effect on the interpolar region of the left kidney. Similar finding along the subdiaphragmatic splenic capsule measuring up to 7 x 2 cm. Has reactive changes noted along the greater curvature of gastric wall. Has complex free fluid noted in the LUQ of the abdomen. Of note, CT a/p 02/2022 had shown regression of the cystic lesion in the neck of the pancreas and focal atrophy of the tail. Labs pertinent for WBC 12.6, Lipase 94, hepatic function panel WNL.  General Surgery on board, appreciate there recommendations. S/p 1 L NS and IV zosyn.   - LR 150 mL/ hr for 24 hours - Zosyn 3.375 g for intra-abdominal infection (pseudocyst/abscess) - PRN IV Dilaudid 0.5-1 mg q2h for severe pain  - PRN PO Tylenol 650 mg q6h  - PRN PO oxycodone 5 mg q3h for moderate pain  - NPO at midnight for IR guided drain 01/26/2024  - Blood Cultures x 2  - Follow up on Surgical cultures - Follow up on ECHO to rule out Endo  - Follow up on Hepatitis Panel        Moderate Left Pleural effusion  Chest pain, MSK likely Noted on CT a/p, no shortness of breath, On room air,  saturating > 94 %. D dimer elevated 2.76, likely in the setting of above. CTA negative for PE. Trop 6 -> 7; Will hold diuresis to allow optimize perfusion. CP likely due to MSK/ referred visceral pain.  - Pain management as per above - Lidocaine patches   Hx of Alcohol Use Reports he quit drinking about 3 years ago, since last hospitalization 12/2021. Denies any current alcohol use.   Diet:  Clear liquids, then NPO MN IVF: LR, 150 cc VTE: SCDs Wounds: None  Code: Full PT/OT recs: none Family Update: Girlfriend at bedside    Dispo: Anticipated discharge to Home in 2 days pending Medical Management.   Signature: Jeral Pinch, D.O.  Internal Medicine Resident, PGY-1 [facility]  Pager: [phone] 7:00 AM, 01/25/2024   Please contact the on call pager after 5 pm and on weekends at [phone].

## 2024-01-25 NOTE — Progress Notes (Signed)
      Chief Complaint/Subjective: Pain slightly better  Objective: Vital signs in last 24 hours: Temp:  [98.5 F (36.9 C)-99.8 F (37.7 C)] 99.8 F (37.7 C) (04/06 0820) Pulse Rate:  [101-113] 108 (04/06 0820) Resp:  [17-23] 17 (04/06 0820) BP: (106-128)/(69-89) 112/78 (04/06 0820) SpO2:  [91 %-99 %] 98 % (04/06 0820)   Intake/Output from previous day: 04/05 0701 - 04/06 0700 In: 2480.6 [P.O.:240; I.V.:1190.6; IV Piggyback:1050] Out: -   PE: Gen: NAD Resp: nonlabored Card: tachycardic Abd: soft, nondistended, pain throughout  Lab Results:  Recent Labs    01/24/24 0501 01/25/24 0459  WBC 12.6* 12.1*  HGB 14.5 12.8*  HCT 43.6 38.2*  PLT 414* 311   Recent Labs    01/24/24 0501 01/25/24 0459  NA 139 135  K 3.9 4.4  CL 103 98  CO2 28 28  GLUCOSE 129* 89  BUN 16 12  CREATININE 0.91 0.89  CALCIUM 8.8* 8.4*   No results for input(s): "LABPROT", "INR" in the last 72 hours.    Component Value Date/Time   NA 135 01/25/2024 0459   K 4.4 01/25/2024 0459   CL 98 01/25/2024 0459   CO2 28 01/25/2024 0459   GLUCOSE 89 01/25/2024 0459   BUN 12 01/25/2024 0459   CREATININE 0.89 01/25/2024 0459   CALCIUM 8.4 (L) 01/25/2024 0459   PROT 5.4 (L) 01/25/2024 0459   ALBUMIN 2.2 (L) 01/25/2024 0459   AST 14 (L) 01/25/2024 0459   ALT 25 01/25/2024 0459   ALKPHOS 47 01/25/2024 0459   BILITOT 0.9 01/25/2024 0459   GFRNONAA >60 01/25/2024 0459   GFRAA >60 09/08/2019 0440    Assessment/Plan Perisplenic abscess - plan for IR drain Pseudocyst tracking near left kidney - seems to have formed a wall, would not recommend drainage as will likely lead to fistula FEN - clear liquids VTE - SCDs ID - zosyn Disposition - IR planned for tomorrow   LOS: 0 days   I reviewed last 24 h vitals and pain scores, last 48 h intake and output, last 24 h labs and trends, and last 24 h imaging results.  This care required moderate level of medical decision making.   De Blanch  [facility] Surgery at [facility] 01/25/2024, 10:29 AM Please see Amion for pager number during day hours 7:00am-4:30pm or 7:00am -11:30am on weekends

## 2024-01-25 NOTE — Plan of Care (Signed)
  Problem: Activity: Goal: Risk for activity intolerance will decrease Outcome: Progressing   Problem: Nutrition: Goal: Adequate nutrition will be maintained Outcome: Progressing   Problem: Pain Managment: Goal: General experience of comfort will improve and/or be controlled Outcome: Progressing   Problem: Coping: Goal: Level of anxiety will decrease Outcome: Progressing   Problem: Safety: Goal: Ability to remain free from injury will improve Outcome: Progressing

## 2024-01-25 NOTE — Consult Note (Signed)
 Chief Complaint: Intra abdominal abscess. Request is for intra abdominal abscess drain placement  Referring Physician(s): Dr. Kris Mouton  Supervising Physician: Gilmer Mor  Patient Status: [facility] - In-pt  History of Present Illness: Joshua Morton is a 28 y.o. male Inpatient. History of HTN, GERD alcohol use and pancreatitis. Presented to the ED at [facility] on 4.5.25 with left sided chest pain. Found to have an elevated d -dimer, a pleural effusion and intra abdominal abscesses. CT Abd pelvis from 4.5.25 reads A 7.5 x 7 cm perinephric loculated likely infected pseudocyst/abscess arising from the pancreatic tail leads to mass effect on the interpolar region of the left kidney. Similar finding along the subdiaphragmatic splenic capsule measuring up to 7 x 2 cm. Patient is being followed by general surgery. Team is requesting an intra abdominal abscess drain placement.   Currently without any significant complaints. Patient alert and laying in bed,calm. Denies any fevers, headache, chest pain, SOB, cough, abdominal pain, nausea, vomiting or bleeding.   WBC is 12.1 albumin 2.2 AST 14. Last temp 99.8. Patient is on subcutaneous prophylactic dose of heparin. Currently discontinued. Last dose given on 4.5.25@ 18:08. NKDA    Return precautions and treatment recommendations and follow-up discussed with the patient *** who is agreeable with the plan.     Past Medical History:  Diagnosis Date   Acute pancreatitis 09/04/2019   Acute respiratory failure with hypoxia (HCC)    Attempted suicide (HCC)    GERD (gastroesophageal reflux disease) 09/04/2019   Pancreatitis, alcoholic, acute 09/04/2019    Past Surgical History:  Procedure Laterality Date   IR GASTR TUBE CONVERT GASTR-JEJ PER W/FL MOD SED      Allergies: Patient has no known allergies.  Medications: Prior to Admission medications   Medication Sig Start Date End Date Taking? Authorizing Provider  acetaminophen (TYLENOL)  500 MG tablet Take 500 mg by mouth every 8 (eight) hours as needed. 01/16/22  Yes [provider]  cyclobenzaprine (FLEXERIL) 5 MG tablet Take 1-2 tablets (5-10 mg total) by mouth 3 (three) times daily as needed for muscle spasms. 01/21/24  Yes Brimage, Vondra, DO  meloxicam (MOBIC) 15 MG tablet Take 1 tablet (15 mg total) by mouth daily. 01/21/24  Yes Katha Cabal, DO     Family History  Problem Relation Age of Onset   Hypertension Mother     Social History   Socioeconomic History   Marital status: Single    Spouse name: Not on file   Number of children: Not on file   Years of education: Not on file   Highest education level: Not on file  Occupational History   Not on file  Tobacco Use   Smoking status: Former    Current packs/day: 0.10    Types: Cigarettes   Smokeless tobacco: Current   Tobacco comments:    vapes enough for about 1-2 cigarettes/day  Vaping Use   Vaping status: Every Day   Substances: Nicotine, Flavoring  Substance and Sexual Activity   Alcohol use: Not Currently    Comment: occ   Drug use: Yes    Frequency: 3.0 times per week    Types: Marijuana   Sexual activity: Yes    Partners: Female  Other Topics Concern   Not on file  Social History Narrative   Not on file   Social Drivers of Health   Financial Resource Strain: Low Risk  (01/02/2022)   Received [facility], [facility]   Overall Financial Resource  Strain (CARDIA)    Difficulty of Paying Living Expenses: Not very hard  Food Insecurity: No Food Insecurity (01/25/2024)   Hunger Vital Sign    Worried About Running Out of Food in the Last Year: Never true    Ran Out of Food in the Last Year: Never true  Transportation Needs: No Transportation Needs (01/25/2024)   PRAPARE - Administrator, Civil Service (Medical): No    Lack of Transportation (Non-Medical): No  Physical Activity: Sufficiently Active (01/02/2022)   Received from [facility], [facility]    Exercise Vital Sign    Days of Exercise per Week: 5 days    Minutes of Exercise per Session: 50 min  Stress: Stress Concern Present (01/02/2022)   Received from [facility], [facility] - Occupational Stress Questionnaire    Feeling of Stress : To some extent  Social Connections: Unknown (07/31/2023)   Received from [facility]   Social Network    Social Network: Not on file    ECOG Status: {CHL ONC ECOG AO:[id number]}  Review of Systems: A 12 point ROS discussed and pertinent positives are indicated in the HPI above.  All other systems are negative.  Review of Systems  Vital Signs: BP 112/78 (BP Location: Left Arm)   Pulse (!) 108   Temp 99.8 F (37.7 C) (Oral)   Resp 17   Ht 5\' 11"  (1.803 m)   Wt 150 lb (68 kg)   SpO2 98%   BMI 20.92 kg/m     Physical Exam  Imaging: CT ABDOMEN PELVIS W CONTRAST Result Date: 01/24/2024 CLINICAL DATA:  Abdominal pain, acute, nonlocalized EXAM: CT ABDOMEN AND PELVIS WITH CONTRAST TECHNIQUE: Multidetector CT imaging of the abdomen and pelvis was performed using the standard protocol following bolus administration of intravenous contrast. RADIATION DOSE REDUCTION: This exam was performed according to the departmental dose-optimization program which includes automated exposure control, adjustment of the mA and/or kV according to patient size and/or use of iterative reconstruction technique. CONTRAST:  75mL OMNIPAQUE IOHEXOL 350 MG/ML SOLN COMPARISON:  CT angiography chest 01/24/2024, CT abdomen pelvis 03/15/2021 FINDINGS: Lower chest: Left pleural effusion. Please see separately dictated CT angiography chest 01/24/2024. Hepatobiliary: No focal liver abnormality. No gallstones, gallbladder wall thickening, or pericholecystic fluid. No biliary dilatation. Pancreas: Interval development of a lobulated peripherally enhancing perinephric fluid density lesion that is likely arising from the pancreatic  tail and measuring up to 7.5 x 7 cm (best evaluated on coronal imaging, 6:25; 7:91)). Finding appears inseparable from the pancreatic tail and kidney. Coarse calcifications along the pancreatic tail may represent chronic pancreatitis. No main pancreatic ductal dilatation. Spleen: Spleen is normal in size. There is a subcapsular fluid density lesion along the spleen/left upper quadrant measuring up to 6.9 x 1.9 cm (6: 32-44). Adrenals/Urinary Tract: No adrenal nodule bilaterally. Bilateral kidneys enhance and excrete symmetrically. Mass effect on the left interpolar region of the kidney from perinephric fluid collection as described in the pancreatic section. No hydronephrosis. No hydroureter.  No nephroureterolithiasis. The urinary bladder is unremarkable. Stomach/Bowel: Question greater curvature of the stomach bowel wall thickening and hazy contour (6:52). No evidence of bowel wall thickening or dilatation. Appendix appears normal. Vascular/Lymphatic: No abdominal aorta or iliac aneurysm. Mild atherosclerotic plaque of the aorta and its branches. No abdominal, pelvic, or inguinal lymphadenopathy. Reproductive: Prostate is unremarkable. Other: Trace volume left upper quadrant free fluid that measures slightly higher than simple fluid. No  intraperitoneal free gas. The perinephric/arising from the pancreas and perisplenic/subdiaphragmatic fluid collections are likely contiguous with one another (6:36). Musculoskeletal: No chest wall abnormality. No suspicious lytic or blastic osseous lesions. No acute displaced fracture. IMPRESSION: 1. Left upper quadrant inflammatory changes with associated abscesses as below. 2. Chronic pancreatitis with underlying acute pancreatitis not excluded, recommend correlation with lipase levels. 3. A 7.5 x 7 cm perinephric loculated likely infected pseudocyst/abscess arising from the pancreatic tail leads to mass effect on the interpolar region of the left kidney. Similar finding along  the subdiaphragmatic splenic capsule measuring up to 7 x 2 cm. The abscesses are likely contiguous with one another. 4. Gastric greater curvature irregular contour and possible thickened wall may represent reactive changes. 5. Trace volume left upper quadrant complex free fluid that could represent infected fluid versus blood products. 6. Left pleural effusion. Please see separately dictated CT angiography chest 01/24/2024. Electronically Signed   By: Tish Frederickson M.D.   On: 01/24/2024 14:29   CT Angio Chest PE W and/or Wo Contrast Result Date: 01/24/2024 CLINICAL DATA:  Acute left-sided chest pain and shortness of breath. EXAM: CT ANGIOGRAPHY CHEST WITH CONTRAST TECHNIQUE: Multidetector CT imaging of the chest was performed using the standard protocol during bolus administration of intravenous contrast. Multiplanar CT image reconstructions and MIPs were obtained to evaluate the vascular anatomy. RADIATION DOSE REDUCTION: This exam was performed according to the departmental dose-optimization program which includes automated exposure control, adjustment of the mA and/or kV according to patient size and/or use of iterative reconstruction technique. CONTRAST:  75mL OMNIPAQUE IOHEXOL 350 MG/ML SOLN COMPARISON:  09/04/2019 FINDINGS: Cardiovascular: Satisfactory opacification of pulmonary arteries noted, and no pulmonary emboli identified. No evidence of thoracic aortic dissection or aneurysm. Mediastinum/Nodes: No masses or pathologically enlarged lymph nodes identified. Lungs/Pleura: Moderate left pleural effusion is seen with left lower lobe atelectasis. Upper abdomen: Several small perisplenic and left Peri renal fluid collections are noted, and are of uncertain etiology. Musculoskeletal: No suspicious bone lesions identified. Review of the MIP images confirms the above findings. IMPRESSION: No evidence of pulmonary embolism. Moderate left pleural effusion with left lower lobe atelectasis. Several small  perisplenic and left perinephric fluid collections, of uncertain etiology. Consider further evaluation with abdomen and pelvis CT with contrast. Electronically Signed   By: Danae Orleans M.D.   On: 01/24/2024 11:59   DG Chest 2 View Result Date: 01/24/2024 CLINICAL DATA:  28 year old male with left chest pain. EXAM: CHEST - 2 VIEW COMPARISON:  Chest radiographs 01/19/2022 and earlier. FINDINGS: AP and lateral views 0509 hours. Moderate opacification of the left lung base which appears at least in part related to pleural effusion. No air bronchograms are visible. Underlying mediastinal contours seem to remain normal. Visualized tracheal air column is within normal limits. No pulmonary edema. No pneumothorax. Right lung appears negative. No osseous abnormality identified.  Negative visible bowel gas. IMPRESSION: Opacified left lung base which is at least in part related to a left pleural effusion (perhaps moderate size). Underlying consolidation/pneumonia not excluded. Right lung appears negative. Electronically Signed   By: Odessa Fleming M.D.   On: 01/24/2024 05:43    Labs:  CBC: Recent Labs    01/24/24 0501 01/25/24 0459  WBC 12.6* 12.1*  HGB 14.5 12.8*  HCT 43.6 38.2*  PLT 414* 311    COAGS: No results for input(s): "INR", "APTT" in the last 8760 hours.  BMP: Recent Labs    01/24/24 0501 01/25/24 0459  NA 139 135  K 3.9  4.4  CL 103 98  CO2 28 28  GLUCOSE 129* 89  BUN 16 12  CALCIUM 8.8* 8.4*  CREATININE 0.91 0.89  GFRNONAA >60 >60    LIVER FUNCTION TESTS: Recent Labs    01/24/24 0644 01/25/24 0459  BILITOT 0.6 0.9  AST 38 14*  ALT 38 25  ALKPHOS 62 47  PROT 6.7 5.4*  ALBUMIN 2.7* 2.2*     Assessment and Plan:  28 y.o. male. Inpatient. History of HTN, GERD,  alcohol use and pancreatitis. Presented to the ED at [facility] on 4.5.25 with left sided chest pain. Found to have an elevated d -dimer, a pleural effusion and intra abdominal abscesses. CT Abd pelvis from 4.5.25 reads A  7.5 x 7 cm perinephric loculated likely infected pseudocyst/abscess arising from the pancreatic tail leads to mass effect on the interpolar region of the left kidney. Similar finding along the subdiaphragmatic splenic capsule measuring up to 7 x 2 cm. Patient is being followed by general surgery. Team is requesting an intra abdominal abscess drain placement.   PLAN: IR Image Guided Intra Abdominal Abscess Drain Placement.  IR consulted for possible intra abdominal abscess drain placement. Case has been reviewed and procedure approved by Dr. Gilmer Mor.  Patient tentatively scheduled for 4.7.25.  Team instructed to: Keep Patient to be NPO after midnight  IR will call patient when ready.  Risks and benefits discussed with the patient including bleeding, infection, damage to adjacent structures, bowel perforation/fistula connection, and sepsis.  All of the patient's questions were answered, patient is agreeable to proceed. Consent signed and in chart.   Thank you for this interesting consult.  I greatly enjoyed meeting [facility] and look forward to participating in their care.  A copy of this report was sent to the requesting provider on this date.  Electronically Signed: Alene Mires, NP 01/25/2024, 11:55 AM   I spent a total of {New WGNF:[id number]} {New Out-Pt:[id number]}  {Established Out-Pt:[id number]} in face to face in clinical consultation, greater than 50% of which was counseling/coordinating care for ***

## 2024-01-25 NOTE — Progress Notes (Signed)
   01/25/24 2043  Assess: MEWS Score  Temp (!) 101 F (38.3 C)  BP 117/70  MAP (mmHg) 84  Pulse Rate (!) 108  Resp 20  Level of Consciousness Alert  SpO2 100 %  O2 Device Room Air  Patient Activity (if Appropriate) In bed  Assess: MEWS Score  MEWS Temp 1  MEWS Systolic 0  MEWS Pulse 1  MEWS RR 0  MEWS LOC 0  MEWS Score 2  MEWS Score Color Yellow  Assess: if the MEWS score is Yellow or Red  Were vital signs accurate and taken at a resting state? Yes  Does the patient meet 2 or more of the SIRS criteria? Yes  Does the patient have a confirmed or suspected source of infection? No  Notify: Charge Nurse/RN  Name of Charge Nurse/RN Notified [place], RN  Provider Notification  Provider Name/Title Lovie Macadamia MD  Date Provider Notified 01/25/24  Time Provider Notified 2130  Method of Notification Page  Notification Reason Other (Comment) (Yellow MEWS)  Provider response See new orders  Date of Provider Response 01/25/24  Time of Provider Response 2131  Assess: SIRS CRITERIA  SIRS Temperature  1  SIRS Respirations  0  SIRS Pulse 1  SIRS WBC 0  SIRS Score Sum  2

## 2024-01-26 ENCOUNTER — Inpatient Hospital Stay (HOSPITAL_COMMUNITY)

## 2024-01-26 DIAGNOSIS — K8689 Other specified diseases of pancreas: Secondary | ICD-10-CM

## 2024-01-26 DIAGNOSIS — I38 Endocarditis, valve unspecified: Secondary | ICD-10-CM | POA: Diagnosis not present

## 2024-01-26 DIAGNOSIS — E8809 Other disorders of plasma-protein metabolism, not elsewhere classified: Secondary | ICD-10-CM

## 2024-01-26 DIAGNOSIS — F1090 Alcohol use, unspecified, uncomplicated: Secondary | ICD-10-CM | POA: Diagnosis not present

## 2024-01-26 DIAGNOSIS — K863 Pseudocyst of pancreas: Secondary | ICD-10-CM | POA: Diagnosis not present

## 2024-01-26 DIAGNOSIS — R1012 Left upper quadrant pain: Secondary | ICD-10-CM

## 2024-01-26 DIAGNOSIS — K861 Other chronic pancreatitis: Secondary | ICD-10-CM | POA: Diagnosis not present

## 2024-01-26 LAB — ECHOCARDIOGRAM COMPLETE
AR max vel: 2.42 cm2
AV Area VTI: 2.82 cm2
AV Area mean vel: 2.48 cm2
AV Mean grad: 2 mmHg
AV Peak grad: 4.4 mmHg
Ao pk vel: 1.05 m/s
Area-P 1/2: 4.68 cm2
Calc EF: 71.6 %
Height: 71 in
MV VTI: 3.43 cm2
S' Lateral: 2.5 cm
Single Plane A2C EF: 71.7 %
Single Plane A4C EF: 69.2 %
Weight: 2400.02 [oz_av]

## 2024-01-26 LAB — PROTIME-INR
INR: 1.2 (ref 0.8–1.2)
Prothrombin Time: 15.6 s — ABNORMAL HIGH (ref 11.4–15.2)

## 2024-01-26 LAB — COMPREHENSIVE METABOLIC PANEL WITH GFR
ALT: 20 U/L (ref 0–44)
AST: 12 U/L — ABNORMAL LOW (ref 15–41)
Albumin: 2.3 g/dL — ABNORMAL LOW (ref 3.5–5.0)
Alkaline Phosphatase: 48 U/L (ref 38–126)
Anion gap: 11 (ref 5–15)
BUN: 8 mg/dL (ref 6–20)
CO2: 29 mmol/L (ref 22–32)
Calcium: 8.6 mg/dL — ABNORMAL LOW (ref 8.9–10.3)
Chloride: 98 mmol/L (ref 98–111)
Creatinine, Ser: 0.92 mg/dL (ref 0.61–1.24)
GFR, Estimated: >60 mL/min (ref >60)
Glucose, Bld: 83 mg/dL (ref 70–99)
Potassium: 4 mmol/L (ref 3.5–5.1)
Sodium: 138 mmol/L (ref 135–145)
Total Bilirubin: 0.9 mg/dL (ref 0.0–1.2)
Total Protein: 6.2 g/dL — ABNORMAL LOW (ref 6.5–8.1)

## 2024-01-26 LAB — CBC
HCT: 38.7 % — ABNORMAL LOW (ref 39.0–52.0)
Hemoglobin: 12.8 g/dL — ABNORMAL LOW (ref 13.0–17.0)
MCH: 31.9 pg (ref 26.0–34.0)
MCHC: 33.1 g/dL (ref 30.0–36.0)
MCV: 96.5 fL (ref 80.0–100.0)
Platelets: 348 10*3/uL (ref 150–400)
RBC: 4.01 MIL/uL — ABNORMAL LOW (ref 4.22–5.81)
RDW: 12.7 % (ref 11.5–15.5)
WBC: 9.9 10*3/uL (ref 4.0–10.5)
nRBC: 0 % (ref 0.0–0.2)

## 2024-01-26 MED ORDER — OXYCODONE HCL 5 MG PO TABS
5.0000 mg | ORAL_TABLET | Freq: Three times a day (TID) | ORAL | Status: DC | PRN
  Administered 2024-01-26 (×2): 5 mg via ORAL
  Filled 2024-01-26 (×2): qty 1

## 2024-01-26 NOTE — Consult Note (Addendum)
 Consultation  Referring Provider: Medicine service/Lau MD Primary Care Physician:  Patient, No Pcp Per Primary Gastroenterologist: Gentry Fitz  Reason for Consultation: Chronic pancreatitis with pseudocysts  HPI: Joshua Morton is a 28 y.o. male with history of EtOH use disorder with previous very heavy use for several years for several years, patient says he has been abstinent since the fall 2022. He has had previous episodes of pancreatitis.  He thinks his first episode occurred in 2022 when he had 2 or 3 episodes after that.  He had a bad episode of pancreatitis in 2023 and required a GJ tube for about 3 months to assist with nutrition.  He was cared for at [facility] by his report at that time.  He says he has not had any episodes of pancreatitis since then.  Currently had onset of abdominal pain a little over a week ago which has progressed.  He says he was feeling some pain in the left side of his chest, down into his left upper quadrant and that the pain has now settled into the left upper quadrant.  He felt a little bit short of breath over this past weekend and felt that his abdomen was distended.  He has not had any fever or documented chills.  Has not been eating well due to pain.  He came to the emergency room and was admitted 3 days ago. CT of the abdomen and pelvis as well as CT angio of the chest on 01/24/2024 show a left pleural effusion, no hepatic abnormality, no gallstones, he has had interval development of a lobulated peripherally enhancing perinephric fluid density lesion arising from the pancreatic tail measuring 7.5 x 7 cm.  This appears inseparable from the pancreatic tail and kidney.  There are coarse calcifications along the pancreatic tail that may represent chronic pancreatitis, no main pancreatic ductal dilation, spleen is normal in size but there is a subcapsular fluid density lesion along the spleen/left upper quadrant measuring up to 6.9 x 1.9 cm, there are  some mass effect on the left interpolar region of the kidney from this fluid collection, question greater curve of the stomach bowel wall thickening and hazy contour, and a trace volume of free fluid in the left upper quadrant.  Also noted that these fluid collections appeared to be contiguous with 1 another.  No acute pancreatitis.  Labs on admission with WBC 12.5/hemoglobin 14.5/medic at 43.6/platelets 414 Lipase 94 Albumin 2.7/LFTs within normal limits D-dimer elevated at 2.76 Triglycerides 28 Blood cultures were done and are negative at this time  Labs today-WBC 9.9/hemoglobin 12.8/ crit 38.7 Potassium 4.0/BUN 8/creatinine 0.92 Pro time 15/INR 1.2  Patient was evaluated by surgery/ yesterday with initial recommendation to plan for IR drainage of the perisplenic abscess, and to avoid drainage of the pseudocyst tracking near the left kidney to avoid fistula.  Patient was also seen by IR and Dr. Fredia Sorrow made recommendations earlier today.  These collections do not appear to be infected, and are consistent with pseudocysts.  He did not recommend IR drainage again due to concern for fistula development.  Patient is still having quite a bit of pain, currently at a 6 or 7 out of 10.  He does not have much appetite and does not feel that he can eat much at this point though he would like to try some liquids. He is main concern is being able to get back to work, states that his employer is aware he is hospitalized, but he does  not want to lose his job. He again denies any recent EtOH use, none since fall 2023.     Past Medical History:  Diagnosis Date   Acute pancreatitis 09/04/2019   Acute respiratory failure with hypoxia (HCC)    Attempted suicide (HCC)    GERD (gastroesophageal reflux disease) 09/04/2019   Pancreatitis, alcoholic, acute 09/04/2019    Past Surgical History:  Procedure Laterality Date   IR GASTR TUBE CONVERT GASTR-JEJ PER W/FL MOD SED      Prior to Admission  medications   Medication Sig Start Date End Date Taking? Authorizing Provider  acetaminophen (TYLENOL) 500 MG tablet Take 500 mg by mouth every 8 (eight) hours as needed. 01/16/22  Yes [provider]  cyclobenzaprine (FLEXERIL) 5 MG tablet Take 1-2 tablets (5-10 mg total) by mouth 3 (three) times daily as needed for muscle spasms. 01/21/24  Yes Brimage, Vondra, DO  meloxicam (MOBIC) 15 MG tablet Take 1 tablet (15 mg total) by mouth daily. 01/21/24  Yes Katha Cabal, DO    Current Facility-Administered Medications  Medication Dose Route Frequency Provider Last Rate Last Admin   acetaminophen (TYLENOL) tablet 650 mg  650 mg Oral Q6H PRN Modena Slater, DO   650 mg at 01/25/24 2051   Or   acetaminophen (TYLENOL) suppository 650 mg  650 mg Rectal Q6H PRN Modena Slater, DO       HYDROmorphone (DILAUDID) injection 0.5-1 mg  0.5-1 mg Intravenous Q2H PRN Laretta Bolster, MD   1 mg at 01/26/24 1047   lidocaine (LIDODERM) 5 % 1 patch  1 patch Transdermal Q24H Tawkaliyar, Roya, DO   1 patch at 01/25/24 1736   oxyCODONE (Oxy IR/ROXICODONE) immediate release tablet 5 mg  5 mg Oral Q8H PRN Laretta Bolster, MD   5 mg at 01/26/24 1402   piperacillin-tazobactam (ZOSYN) IVPB 3.375 g  3.375 g Intravenous Q8H Daylene Posey, RPH 12.5 mL/hr at 01/26/24 1429 3.375 g at 01/26/24 1429   polyethylene glycol (MIRALAX / GLYCOLAX) packet 17 g  17 g Oral Daily PRN Modena Slater, DO        Allergies as of 01/24/2024   (No Known Allergies)    Family History  Problem Relation Age of Onset   Hypertension Mother     Social History   Socioeconomic History   Marital status: Single    Spouse name: Not on file   Number of children: Not on file   Years of education: Not on file   Highest education level: Not on file  Occupational History   Not on file  Tobacco Use   Smoking status: Former    Current packs/day: 0.10    Types: Cigarettes   Smokeless tobacco: Current   Tobacco comments:    vapes enough for about  1-2 cigarettes/day  Vaping Use   Vaping status: Every Day   Substances: Nicotine, Flavoring  Substance and Sexual Activity   Alcohol use: Not Currently    Comment: occ   Drug use: Yes    Frequency: 3.0 times per week    Types: Marijuana   Sexual activity: Yes    Partners: Female  Other Topics Concern   Not on file  Social History Narrative   Not on file   Social Drivers of Health   Financial Resource Strain: Low Risk  (01/02/2022)   [facility]   Overall Financial Resource Strain (CARDIA)    Difficulty of Paying Living Expenses: Not very hard  Food Insecurity: No  Food Insecurity (01/25/2024)   Hunger Vital Sign    Worried About Running Out of Food in the Last Year: Never true    Ran Out of Food in the Last Year: Never true  Transportation Needs: No Transportation Needs (01/25/2024)   PRAPARE - Administrator, Civil Service (Medical): No    Lack of Transportation (Non-Medical): No  Physical Activity: Sufficiently Active (01/02/2022)   Received from [facility], [facility]   Exercise Vital Sign    Days of Exercise per Week: 5 days    Minutes of Exercise per Session: 50 min  Stress: Stress Concern Present (01/02/2022)   Received from [facility], [facility] of Occupational Health - Occupational Stress Questionnaire    Feeling of Stress : To some extent  Social Connections: Unknown (07/31/2023)   Received from [facility]   Social Network    Social Network: Not on file  Intimate Partner Violence: Not At Risk (01/25/2024)   Humiliation, Afraid, Rape, and Kick questionnaire    Fear of Current or Ex-Partner: No    Emotionally Abused: No    Physically Abused: No    Sexually Abused: No    Review of Systems: Pertinent positive and negative review of systems were noted in the above HPI section.  All other review of systems was otherwise negative.   Physical Exam: Vital signs in last 24  hours: Temp:  [98.2 F (36.8 C)-101 F (38.3 C)] 98.5 F (36.9 C) (04/07 1011) Pulse Rate:  [86-108] 103 (04/07 1011) Resp:  [16-20] 16 (04/07 1011) BP: (109-119)/(70-81) 109/76 (04/07 1011) SpO2:  [95 %-100 %] 95 % (04/07 1011)   General:   Alert,  Well-developed, thin, young white male pleasant and cooperative in NAD, uncomfortable appearing Head:  Normocephalic and atraumatic. Eyes:  Sclera clear, no icterus.   Conjunctiva pink. Ears:  Normal auditory acuity. Nose:  No deformity, discharge,  or lesions. Mouth:  No deformity or lesions.   Neck:  Supple; no masses or thyromegaly. Lungs: Complex clear throughout to auscultation.   No wheezes, crackles, or rhonchi.  Heart:  tachyRegular rate and rhythm; no murmurs, clicks, rubs,  or gallops. Abdomen:  Soft, nondistended, bowel sounds are present, quite tender in the epigastrium and left upper quadrant, no rebound, no palpable mass Rectal:  Deferred  Msk:  Symmetrical without gross deformities. . Pulses:  Normal pulses noted. Extremities:  Without clubbing or edema. Neurologic:  Alert and  oriented x4;  grossly normal neurologically. Skin:  Intact without significant lesions or rashes.. Psych:  Alert and cooperative. Normal mood and affect.  Intake/Output from previous day: 04/06 0701 - 04/07 0700 In: 2665.5 [P.O.:480; I.V.:1996.1; IV Piggyback:189.5] Out: -  Intake/Output this shift: Total I/O In: 10.6 [IV Piggyback:10.6] Out: -   Lab Results: Recent Labs    01/24/24 0501 01/25/24 0459 01/26/24 0736  WBC 12.6* 12.1* 9.9  HGB 14.5 12.8* 12.8*  HCT 43.6 38.2* 38.7*  PLT 414* 311 348   BMET Recent Labs    01/24/24 0501 01/25/24 0459 01/26/24 0736  NA 139 135 138  K 3.9 4.4 4.0  CL 103 98 98  CO2 28 28 29   GLUCOSE 129* 89 83  BUN 16 12 8   CREATININE 0.91 0.89 0.92  CALCIUM 8.8* 8.4* 8.6*   LFT Recent Labs    01/24/24 0644 01/25/24 0459 01/26/24 0736  PROT 6.7   < > 6.2*  ALBUMIN 2.7*   < > 2.3*  AST  38   < > 12*  ALT 38   < > 20  ALKPHOS 62   < > 48  BILITOT 0.6   < > 0.9  BILIDIR 0.2  --   --   IBILI 0.4  --   --    < > = values in this interval not displayed.   PT/INR Recent Labs    01/26/24 0736  LABPROT 15.6*  INR 1.2   Hepatitis Panel Recent Labs    01/25/24 1005  HEPBSAG NON REACTIVE  HCVAB NON REACTIVE  HEPAIGM NON REACTIVE  HEPBIGM NON REACTIVE      IMPRESSION:  #86 28 year old white male with previous history of heavy EtOH use times several years, abstinent since fall 2023, and previous episodes of pancreatitis.  He had a prolonged episode in 2002 when he was cared for at [facility] and required a GJ tube for about 3 months for nutritional support. No episodes of pancreatitis since that time and no EtOH use  Presents now with onset of pain about a week ago, progressive to the point that he could not tolerate.  Also decrease in p.o. intake and increased pain with eating. He says he initially had some pain in his left chest radiating down into his left upper quadrant but the pain has settled into the left upper quadrant at this point.  CT angio of the chest negative CT abdomen and pelvis as outlined above shows what appears to be some degree of chronic pancreatitis with calcifications in the pancreatic tail, though no main pancreatic duct dilation to suggest stricture He has 2 fluid collections consistent with pain or attic pseudocysts, 1 is perinephric, and the other interestingly is a subcapsular fluid collection along the spleen/left upper quadrant.  These are felt to be communicating. No evidence of infected pseudocyst by imaging or labs  IR does not feel that these need to be drained or sampled   #2 hypoalbuminemia  PLAN: Diet clear to full liquid as tolerates Adequate pain control No indication for IV antibiotics at present He does not need an EUS now-he will require outpatient GI follow-up, and repeat imaging in 3 to 4 weeks depending on his course.   Hopefully the pseudocyst will gradually resolve with time, and will not require any invasive intervention. GI will follow with you   Amy Esterwood PA-C 01/26/2024, 2:31 PM  I have taken an interval history, thoroughly reviewed the chart and examined the patient. I agree with the Advanced Practitioner's note, impression and recommendations, and have recorded additional findings, impressions and recommendations below. I performed a substantive portion of this encounter (>50% time spent), including a complete performance of the medical decision making.  My additional thoughts are as follows:  Communicating pancreatic pseudocysts causing left upper quadrant/epigastric pain escalating over the last week.  Interventional radiology consult greatly appreciated, and those consultants feel that the fluid is not definitely infected.  Nevertheless, primary medical team has felt it prudent to keep him on antibiotics because of the surrounding stranding and concern for possible infection. I agree it would be unwise to do a percutaneous drainage and run the risk of introducing infection or creating a pancreatico cutaneous fistula.  Duration of the pseudocyst is unknown, though I do find it curious that they developed after he reports last alcohol use 3 years ago. He has definite imaging findings of chronic calcific pancreatitis, though no reported pancreatic ductal dilatation to suggest a distal PD stricture that might be contributing to this.  At this point, our recommendations are to treat this conservatively with judicious use of pain medication, close monitoring and continue rotation of regular diet as tolerated.  He was not having nausea or vomiting in the outpatient setting, but his food intake was limited due to the pain.  He also does not of chronic diarrhea or weight loss to suggest exocrine pancreatic insufficiency. We would then plan to repeat imaging in several weeks to see if there is any interval  change that might require evaluation with EUS.  I will ask our advanced endoscopist to take a look at the case when they can to see if they have anything to add regarding the possibility of eventual need for EUS, though I suspect they will want interval imaging before considering that.   Charlie Pitter III [phone]

## 2024-01-26 NOTE — Progress Notes (Signed)
 Pt requested information on Pancreatitis and what diet he should aim to follow to reduce incidents of a flair from food. Clinical references printed and provided to the patient.

## 2024-01-26 NOTE — TOC Initial Note (Addendum)
 Transition of Care (TOC) - Initial/Assessment Note   Spoke to patient at bedside. Patient travels for work , when home lives with grandparents.    Patient has [employer]   Patient does not have PCP. NCM explained he can call the number on his insurance card or visit website and be provided a list of providers in network and chose from list and call to schedule appointment .   Patient voiced understanding   If IR places a drain, bedside nurse will provide drain education   Patient Details  Name: Joshua Morton MRN: [id number] Date of Birth: [birth year]  Transition of Care [facility]) CM/SW Contact:    Kingsley Plan, RN Phone Number: 01/26/2024, 11:13 AM  Clinical Narrative:                   Expected Discharge Plan: Home/Self Care Barriers to Discharge: Continued Medical Work up   Patient Goals and CMS Choice Patient states their goals for this hospitalization and ongoing recovery are:: to return to home          Expected Discharge Plan and Services In-house Referral: NA Discharge Planning Services: CM Consult Post Acute Care Choice: NA                   DME Arranged: N/A DME Agency: NA       HH Arranged: NA HH Agency: NA        Prior Living Arrangements/Services   Lives with::  (travels for work , when home lives with grandparents) Patient language and need for interpreter reviewed:: Yes Do you feel safe going back to the place where you live?: Yes      Need for Family Participation in Patient Care: Yes (Comment) Care giver support system in place?: Yes (comment)   Criminal Activity/Legal Involvement Pertinent to Current Situation/Hospitalization: No - Comment as needed  Activities of Daily Living   ADL Screening (condition at time of admission) Independently performs ADLs?: Yes (appropriate for developmental age) Is the patient deaf or have difficulty hearing?: No Does the patient have difficulty seeing, even when wearing glasses/contacts?:  No Does the patient have difficulty concentrating, remembering, or making decisions?: No  Permission Sought/Granted   Permission granted to share information with : No              Emotional Assessment Appearance:: Appears stated age Attitude/Demeanor/Rapport: Engaged Affect (typically observed): Appropriate Orientation: : Oriented to Self, Oriented to Place, Oriented to  Time, Oriented to Situation Alcohol / Substance Use: Not Applicable Psych Involvement: No (comment)  Admission diagnosis:  Pancreatic pseudocyst [K86.3] Pleural effusion on left [J90] Acute on chronic pancreatitis (HCC) [K85.90, K86.1] Patient Active Problem List   Diagnosis Date Noted   Acute on chronic pancreatitis (HCC) 01/24/2024   Marijuana abuse 01/20/2022   Pancreatic pseudocyst 01/02/2022   Alcoholic pancreatitis 03/15/2021   Panic disorder 12/09/2019   Abdominal pain 12/08/2019   Essential hypertension    Tachycardia    Fatty liver 09/04/2019   GERD (gastroesophageal reflux disease) 09/04/2019   Procedure and treatment not carried out because of patient's decision for other reasons 06/26/2012   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 [place] [address] Phone: [phone] Fax: [phone]  [id number] - [place], [place] - [address] [place] [address] AT  [facility] [address] [place] [address] [facility] [place] [address] Phone: [phone] Fax: [phone]  [facility] [address], [place] - [address] [place] [address] Phone: [phone] Fax: [phone]  [facility] [address] [place] - [address] [place] [address] Phone: [phone] Fax: [phone]  [facility] [address] [place] - [address] [place] [address] Phone: [phone] Fax: [phone]     Social Drivers of Health (SDOH) Social History: SDOH Screenings   Food Insecurity: No Food Insecurity (01/25/2024)  Housing: Low Risk  (01/25/2024)  Transportation Needs: No Transportation Needs (01/25/2024)  Utilities: Not At Risk (01/25/2024)  Financial Resource Strain: Low Risk  (01/02/2022)   Received from [facility]  Physical Activity: Sufficiently Active (01/02/2022)   Received from [facility]  Social Connections: Unknown (07/31/2023)   Received from [facility]  Stress: Stress Concern Present (01/02/2022)   [facility]  Tobacco Use: High Risk (01/24/2024)  Health Literacy: Low Risk  (01/02/2022)   [facility] from [facility], [facility]   SDOH Interventions:     Readmission Risk Interventions     No data to display

## 2024-01-26 NOTE — Progress Notes (Addendum)
 HD#1 Subjective:   Summary: Joshua Morton is a 28 y.o. male with PMH of previous alcohol use, multiple pancreatitis/alcoholic pancreatitis, s/p GJ tube placement 12/2021 for pseudocyst, had it replaced on 01/21/2022, presents today for chest pain admitted for loculated pseudocyst/abscess.   Overnight Events: None  Interim history:  He reported worsening pain in the left upper quadrant, wraps around to the back.  No nausea no vomiting.   IV Pain regimen helps, last for about 2 hours.  Both IR and general surgery not recommending drainage or surgical debridement of the abscess.  I have consulted GI to see they have any recs.  Objective:  Vital signs in last 24 hours: Vitals:   01/25/24 2223 01/26/24 0220 01/26/24 0556 01/26/24 1011  BP: 119/81 116/72 112/70 109/76  Pulse: 100 98 86 (!) 103  Resp: 18 20 18 16   Temp: 99.1 F (37.3 C) 98.2 F (36.8 C) 98.3 F (36.8 C) 98.5 F (36.9 C)  TempSrc: Oral Oral Oral Oral  SpO2: 96% 100% 96% 95%  Weight:      Height:       Supplemental O2: Room Air SpO2: 95 %   Physical Exam:   Constitutional: Laying in bed, no acute distress HENT: normocephalic atraumatic, mucous membranes moist Cardiovascular: tachycardiac  Pulmonary/Chest: normal work of breathing on room air, lungs clear to auscultation bilaterally Abdominal: +LUQ/ epigastric tenderness, no signs of peritonitis or guarding.  Filed Weights   01/24/24 0455  Weight: 68 kg     Intake/Output Summary (Last 24 hours) at 01/26/2024 1154 Last data filed at 01/26/2024 0900 Gross per 24 hour  Intake 2665.5 ml  Output --  Net 2665.5 ml   Net IO Since Admission: 5,146.14 mL [01/26/24 1154]  Pertinent Labs:    Latest Ref Rng & Units 01/26/2024    7:36 AM 01/25/2024    4:59 AM 01/24/2024    5:01 AM  CBC  WBC 4.0 - 10.5 K/uL 9.9  12.1  12.6   Hemoglobin 13.0 - 17.0 g/dL 78.2  95.6  21.3   Hematocrit 39.0 - 52.0 % 38.7  38.2  43.6   Platelets 150 - 400 K/uL 348  311  414         Latest Ref Rng & Units 01/26/2024    7:36 AM 01/25/2024    4:59 AM 01/24/2024    6:44 AM  CMP  Glucose 70 - 99 mg/dL 83  89    BUN 6 - 20 mg/dL 8  12    Creatinine 0.86 - 1.24 mg/dL 5.78  4.69    Sodium 629 - 145 mmol/L 138  135    Potassium 3.5 - 5.1 mmol/L 4.0  4.4    Chloride 98 - 111 mmol/L 98  98    CO2 22 - 32 mmol/L 29  28    Calcium 8.9 - 10.3 mg/dL 8.6  8.4    Total Protein 6.5 - 8.1 g/dL 6.2  5.4  6.7   Total Bilirubin 0.0 - 1.2 mg/dL 0.9  0.9  0.6   Alkaline Phos 38 - 126 U/L 48  47  62   AST 15 - 41 U/L 12  14  38   ALT 0 - 44 U/L 20  25  38     Imaging: No results found.   Assessment/Plan:   Principal Problem:   Acute on chronic pancreatitis (HCC) Active Problems:   GERD (gastroesophageal reflux disease)   Pancreatic pseudocyst   Patient Summary: Joshua Morton  [place] is a 28 y.o. male with significant history of previous alcohol use, multiple pancreatitis/alcoholic pancreatitis, s/p GJ tube placement 12/2021 for pseudocyst, had it replaced on 01/21/2022, presents today for chest pain admitted for loculated pseudocyst/abscess.    Pseudocysts/abscesses pancreatic tail and splenic capsule  Acute on chronic pancreatitis    Leukocytosis  Afebrile, leukocytosis has resolved, BCx NGTD.  His epigastric pain is worse this morning, I checked the MAR, patient has only been getting the IV Dilaudid, and only used PRN oxy once.  Both general surgery and IR will not drain the splenic abscess  and pseudocyst, with improving WBC, the abscess will evolve and likely resolve.  Currently on IV Zosyn for intra-abdominal infection.   GI have been consulted, will follow up on your recs. - Zosyn 3.375 g for intra-abdominal infection (pseudocyst/abscess).  - PRN IV Dilaudid 0.5-1 mg q2h for severe pain  - Schedule tylenol 650 - Oxycodone 5 mg q8h PRN for moderate pain   - Blood Cultures x 2  - Follow up on ECHO       Moderate Left Pleural effusion  Chest pain, MSK  likely Noted on CT a/p, no shortness of breath, On room air, saturating > 94 %. D dimer elevated 2.76, likely in the setting of above. CTA negative for PE. Trop 6 -> 7. CP likely due to MSK/ referred visceral pain.  - Pain management as per above - Lidocaine patches   Hx of Alcohol Use Reports he quit drinking about 3 years ago, since last hospitalization 12/2021. Denies any current alcohol use.   Diet:  Normal Diet IVF:  VTE: SCDs Wounds: None  Code: Full PT/OT recs: none Family Update: Girlfriend at bedside    Dispo: Anticipated discharge to Home in 2 days pending Medical Management.   Signature: Laretta Bolster, M.D.  Internal Medicine Resident, PGY-1 [facility]  Pager: [phone] 11:54 AM, 01/26/2024

## 2024-01-26 NOTE — Plan of Care (Signed)
  Problem: Education: Goal: Knowledge of General Education information will improve Description: Including pain rating scale, medication(s)/side effects and non-pharmacologic comfort measures Outcome: Progressing   Problem: Clinical Measurements: Goal: Ability to maintain clinical measurements within normal limits will improve Outcome: Progressing   Problem: Activity: Goal: Risk for activity intolerance will decrease Outcome: Progressing   Problem: Nutrition: Goal: Adequate nutrition will be maintained Outcome: Progressing   Problem: Coping: Goal: Level of anxiety will decrease Outcome: Progressing   Problem: Pain Managment: Goal: General experience of comfort will improve and/or be controlled Outcome: Progressing

## 2024-01-26 NOTE — Progress Notes (Signed)
 Pain medication given for score greater than prescribed per MD Koomson communication and order.

## 2024-01-26 NOTE — Progress Notes (Signed)
  Echocardiogram 2D Echocardiogram has been performed.  Ocie Doyne RDCS 01/26/2024, 1:53 PM

## 2024-01-27 DIAGNOSIS — K8689 Other specified diseases of pancreas: Secondary | ICD-10-CM | POA: Diagnosis not present

## 2024-01-27 DIAGNOSIS — K863 Pseudocyst of pancreas: Secondary | ICD-10-CM | POA: Diagnosis not present

## 2024-01-27 DIAGNOSIS — K861 Other chronic pancreatitis: Secondary | ICD-10-CM | POA: Diagnosis not present

## 2024-01-27 DIAGNOSIS — F1090 Alcohol use, unspecified, uncomplicated: Secondary | ICD-10-CM | POA: Diagnosis not present

## 2024-01-27 LAB — CBC
HCT: 40.3 % (ref 39.0–52.0)
Hemoglobin: 13.5 g/dL (ref 13.0–17.0)
MCH: 31.9 pg (ref 26.0–34.0)
MCHC: 33.5 g/dL (ref 30.0–36.0)
MCV: 95.3 fL (ref 80.0–100.0)
Platelets: 370 10*3/uL (ref 150–400)
RBC: 4.23 MIL/uL (ref 4.22–5.81)
RDW: 12.4 % (ref 11.5–15.5)
WBC: 8.7 10*3/uL (ref 4.0–10.5)
nRBC: 0 % (ref 0.0–0.2)

## 2024-01-27 LAB — BASIC METABOLIC PANEL WITH GFR
Anion gap: 12 (ref 5–15)
BUN: 9 mg/dL (ref 6–20)
CO2: 26 mmol/L (ref 22–32)
Calcium: 8.9 mg/dL (ref 8.9–10.3)
Chloride: 97 mmol/L — ABNORMAL LOW (ref 98–111)
Creatinine, Ser: 0.91 mg/dL (ref 0.61–1.24)
GFR, Estimated: >60 mL/min (ref >60)
Glucose, Bld: 85 mg/dL (ref 70–99)
Potassium: 4.4 mmol/L (ref 3.5–5.1)
Sodium: 135 mmol/L (ref 135–145)

## 2024-01-27 MED ORDER — OXYCODONE HCL ER 10 MG PO T12A
10.0000 mg | EXTENDED_RELEASE_TABLET | Freq: Two times a day (BID) | ORAL | Status: DC
  Administered 2024-01-27: 10 mg via ORAL
  Filled 2024-01-27: qty 1

## 2024-01-27 MED ORDER — ACETAMINOPHEN 500 MG PO TABS
1000.0000 mg | ORAL_TABLET | Freq: Three times a day (TID) | ORAL | Status: DC
  Administered 2024-01-27 – 2024-01-28 (×4): 1000 mg via ORAL
  Filled 2024-01-27 (×4): qty 2

## 2024-01-27 MED ORDER — CIPROFLOXACIN HCL 500 MG PO TABS
500.0000 mg | ORAL_TABLET | Freq: Two times a day (BID) | ORAL | Status: DC
  Administered 2024-01-27 – 2024-01-28 (×2): 500 mg via ORAL
  Filled 2024-01-27 (×2): qty 1

## 2024-01-27 MED ORDER — ACETAMINOPHEN 500 MG PO TABS: 1000.0000 mg | ORAL_TABLET | Freq: Two times a day (BID) | ORAL | Status: DC

## 2024-01-27 MED ORDER — OXYCODONE HCL 5 MG PO TABS: 5.0000 mg | ORAL_TABLET | ORAL | Status: DC | PRN

## 2024-01-27 MED ORDER — METRONIDAZOLE 500 MG PO TABS
500.0000 mg | ORAL_TABLET | Freq: Two times a day (BID) | ORAL | Status: DC
  Administered 2024-01-27 – 2024-01-28 (×2): 500 mg via ORAL
  Filled 2024-01-27 (×2): qty 1

## 2024-01-27 MED ORDER — ONDANSETRON HCL 4 MG PO TABS
4.0000 mg | ORAL_TABLET | ORAL | Status: DC | PRN
  Administered 2024-01-27 (×2): 4 mg via ORAL
  Filled 2024-01-27 (×2): qty 1

## 2024-01-27 MED ORDER — OXYCODONE HCL 5 MG PO TABS
10.0000 mg | ORAL_TABLET | ORAL | Status: DC | PRN
  Administered 2024-01-27 – 2024-01-28 (×5): 10 mg via ORAL
  Filled 2024-01-27 (×5): qty 2

## 2024-01-27 NOTE — Progress Notes (Signed)
   01/27/24 1626  Assess: MEWS Score  Temp 98.2 F (36.8 C)  BP 136/80  MAP (mmHg) 96  Pulse Rate (!) 137  Resp 18  Level of Consciousness Alert  SpO2 98 %  O2 Device Room Air  Patient Activity (if Appropriate) In bed  Assess: MEWS Score  MEWS Temp 0  MEWS Systolic 0  MEWS Pulse 3  MEWS RR 0  MEWS LOC 0  MEWS Score 3  MEWS Score Color Yellow  Assess: if the MEWS score is Yellow or Red  Were vital signs accurate and taken at a resting state? Yes  Does the patient meet 2 or more of the SIRS criteria? No  Does the patient have a confirmed or suspected source of infection? No  Notify: Charge Nurse/RN  Name of Charge Nurse/RN Patent attorney  Provider Notification  Provider Name/Title Laretta Bolster  Date Provider Notified 01/27/24  Time Provider Notified 1628  Method of Notification Page (Chat)  Notification Reason Other (Comment) (Yellow MEWS)  Provider response Other (Comment) (Called patient to reassure him and reduce anxiety that may be contributing to increased HR)  Date of Provider Response 01/27/24  Time of Provider Response 1629  Assess: SIRS CRITERIA  SIRS Temperature  0  SIRS Respirations  0  SIRS Pulse 1  SIRS WBC 0  SIRS Score Sum  1

## 2024-01-27 NOTE — Plan of Care (Signed)
  Problem: Education: Goal: Knowledge of General Education information will improve Description: Including pain rating scale, medication(s)/side effects and non-pharmacologic comfort measures Outcome: Progressing   Problem: Clinical Measurements: Goal: Ability to maintain clinical measurements within normal limits will improve Outcome: Progressing Goal: Will remain free from infection Outcome: Progressing Goal: Diagnostic test results will improve Outcome: Progressing   Problem: Activity: Goal: Risk for activity intolerance will decrease Outcome: Progressing   Problem: Nutrition: Goal: Adequate nutrition will be maintained Outcome: Progressing   Problem: Elimination: Goal: Will not experience complications related to urinary retention Outcome: Progressing   Problem: Pain Managment: Goal: General experience of comfort will improve and/or be controlled Outcome: Progressing   Problem: Safety: Goal: Ability to remain free from injury will improve Outcome: Progressing

## 2024-01-27 NOTE — Progress Notes (Addendum)
 HD#2 Subjective:   Summary: Joshua Morton is a 29 y.o. male with PMH of previous alcohol use, multiple pancreatitis/alcoholic pancreatitis, s/p GJ tube placement 12/2021 for pseudocyst, had it replaced on 01/21/2022, presents today for chest pain admitted for loculated pseudocyst/abscess.   Overnight Events: NAEOV Required multiple doses of IV morphine for better pain control.  Interim history:  This morning, he states pain was not better controlled overnight after the pain medication adjustment.  He tolerated clear liquid diet, no nausea/vomiting.  He was seen  and evaluated by GI yesterday, not recommending EUS.  Objective:  Vital signs in last 24 hours: Vitals:   01/26/24 2018 01/27/24 0413 01/27/24 0414 01/27/24 0745  BP: 119/79 103/73 103/73 122/77  Pulse: 100 85 85 94  Resp: 18 18 18 18   Temp: 98.9 F (37.2 C) 97.8 F (36.6 C) 97.8 F (36.6 C) 98 F (36.7 C)  TempSrc: Oral  Oral Oral  SpO2: 95% 98% 98% 98%  Weight:      Height:       Supplemental O2: Room Air SpO2: 98 %   Physical Exam:   Constitutional: Laying in bed, no acute distress HENT: normocephalic atraumatic, mucous membranes moist Cardiovascular: tachycardiac  Pulmonary/Chest: Clear lungs bilaterally. Abdominal: Mild +LUQ/ epigastric tenderness, no signs of peritonitis or guarding.  Filed Weights   01/24/24 0455  Weight: 68 kg     Intake/Output Summary (Last 24 hours) at 01/27/2024 1430 Last data filed at 01/27/2024 1416 Gross per 24 hour  Intake 590 ml  Output --  Net 590 ml   Net IO Since Admission: 5,746.69 mL [01/27/24 1430]  Pertinent Labs:    Latest Ref Rng & Units 01/27/2024    6:50 AM 01/26/2024    7:36 AM 01/25/2024    4:59 AM  CBC  WBC 4.0 - 10.5 K/uL 8.7  9.9  12.1   Hemoglobin 13.0 - 17.0 g/dL 16.1  09.6  04.5   Hematocrit 39.0 - 52.0 % 40.3  38.7  38.2   Platelets 150 - 400 K/uL 370  348  311        Latest Ref Rng & Units 01/27/2024    6:50 AM 01/26/2024    7:36 AM  01/25/2024    4:59 AM  CMP  Glucose 70 - 99 mg/dL 85  83  89   BUN 6 - 20 mg/dL 9  8  12    Creatinine 0.61 - 1.24 mg/dL 4.09  8.11  9.14   Sodium 135 - 145 mmol/L 135  138  135   Potassium 3.5 - 5.1 mmol/L 4.4  4.0  4.4   Chloride 98 - 111 mmol/L 97  98  98   CO2 22 - 32 mmol/L 26  29  28    Calcium 8.9 - 10.3 mg/dL 8.9  8.6  8.4   Total Protein 6.5 - 8.1 g/dL  6.2  5.4   Total Bilirubin 0.0 - 1.2 mg/dL  0.9  0.9   Alkaline Phos 38 - 126 U/L  48  47   AST 15 - 41 U/L  12  14   ALT 0 - 44 U/L  20  25     Imaging: No results found.   Assessment/Plan:   Principal Problem:   Acute on chronic pancreatitis (HCC) Active Problems:   GERD (gastroesophageal reflux disease)   Pancreatic pseudocyst   Abdominal pain, left upper quadrant   Patient Summary: Joshua Morton is a 28 y.o. male with significant history  of previous alcohol use, multiple pancreatitis/alcoholic pancreatitis, s/p GJ tube placement 12/2021 for pseudocyst, had it replaced on 01/21/2022, presents today for chest pain admitted for loculated pseudocyst/abscess.    Pseudocysts/abscesses pancreatic tail and splenic capsule  Acute on chronic pancreatitis    Leukocytosis  Had suboptimal p.o. regimen overnight, otherwise overall improved.  Normal Echo. He was evaluated by interventional radiology, general surgery, and GI, for possible drainage of the perisplenic abscess.  All consultants are in favor of not draining the abscess, as there is low suspicion for infected fluid now. Also the location of the fluid is suboptimal for drainage, making the risk of introducing infection>> benefits.  Agreeable to continuing IV antibiotics. Will adjust the patient pain regimen, and advance his diet.  Patient does not have a PCP, he is agreeing to following with [facility].  - GI following appreciate rec's. - Tolerating liquid diet, will advance to soft diet. - Discontinue Zosyn - Discontinue IV morphine - Start po  Cipro 500 mg BID ( 3/7) and flagyl 500 mg  BID. - Scheduled Tylenol 1000 mg TID.  - Increase Oxycodone 5 mg to 10 mg Q4hrs - HFU on 4/15, confirmed.       Moderate Left Pleural effusion  Chest pain, MSK likely Improved, pain likely secondary to perisplenic abscess. - Pain management as per above - Lidocaine patches   Hx of Alcohol Use Reports he quit drinking about 3 years ago, since last hospitalization 12/2021. Denies any current alcohol use.   Diet:  Normal Diet IVF:  None VTE: SCDs Wounds: None  Code: Full PT/OT recs: none Family Update: Girlfriend at bedside    Dispo: Anticipated discharge to Home in 2 days pending Medical Management.   Signature: Laretta Bolster, M.D.  Internal Medicine Resident, PGY-1 [facility]  Pager: [phone] 2:30 PM, 01/27/2024

## 2024-01-27 NOTE — Progress Notes (Addendum)
 Patient ID: Joshua Morton, male   DOB: 1996/03/14, 28 y.o.   MRN: [id number]    Progress Note   Subjective   Day # 4 CC; acute progressive abdominal pain, probable underlying chronic pancreatitis with pancreatic pseudocysts  Currently on oral Cipro and IV metronidazole  Labs today-WBC 8.7/hemoglobin 13.5/hematocrit 40.3 Blood cultures negative x 2 days  Patient says he was able to eat some solid food this morning, has been nauseated this afternoon.  He says overall he thinks his pain has improved since admission and is now more tolerable.  He is trying not to take pain medication.  No complaints of shortness of breath or chest pain today    Objective   Vital signs in last 24 hours: Temp:  [97.8 F (36.6 C)-98.9 F (37.2 C)] 98.2 F (36.8 C) (04/08 1626) Pulse Rate:  [85-137] 137 (04/08 1626) Resp:  [18] 18 (04/08 1626) BP: (103-136)/(73-80) 136/80 (04/08 1626) SpO2:  [95 %-98 %] 98 % (04/08 1626)   General:   Young white male in NAD, thin, Heart:  Regular rate and rhythm; no murmurs Lungs somewhat decreased breath sounds left base Abdomen:  Soft, nondistended, tender in the left upper abdomen, no rebound, no palpable mass or hepatosplenomegaly Extremities:  Without edema. Neurologic:  Alert and oriented,  grossly normal neurologically. Psych:  Cooperative. Normal mood and affect.  Intake/Output from previous day: 04/07 0701 - 04/08 0700 In: 10.6 [IV Piggyback:10.6] Out: -  Intake/Output this shift: Total I/O In: 590 [P.O.:590] Out: -   Lab Results: Recent Labs    01/25/24 0459 01/26/24 0736 01/27/24 0650  WBC 12.1* 9.9 8.7  HGB 12.8* 12.8* 13.5  HCT 38.2* 38.7* 40.3  PLT 311 348 370   BMET Recent Labs    01/25/24 0459 01/26/24 0736 01/27/24 0650  NA 135 138 135  K 4.4 4.0 4.4  CL 98 98 97*  CO2 28 29 26   GLUCOSE 89 83 85  BUN 12 8 9   CREATININE 0.89 0.92 0.91  CALCIUM 8.4* 8.6* 8.9   LFT Recent Labs    01/26/24 0736  PROT 6.2*  ALBUMIN  2.3*  AST 12*  ALT 20  ALKPHOS 48  BILITOT 0.9   PT/INR Recent Labs    01/26/24 0736  LABPROT 15.6*  INR 1.2    Studies/Results: ECHOCARDIOGRAM COMPLETE Result Date: 01/26/2024    ECHOCARDIOGRAM REPORT   Patient Name:   Joshua Morton Date of Exam: 01/26/2024 Medical Rec #:  [id number]           Height:       71.0 in Accession #:    [id number]          Weight:       150.0 lb Date of Birth:  Mar 09, 1996           BSA:          1.866 m Patient Age:    28 years            BP:           109/76 mmHg Patient Gender: M                   HR:           101 bpm. Exam Location:  Inpatient Procedure: 2D Echo, Cardiac Doppler and Color Doppler (Both Spectral and Color            Flow Doppler were utilized during procedure). Indications:    Endocarditis  History:        Patient has prior history of Echocardiogram examinations, most                 recent 09/07/2019. Abnormal ECG, Arrythmias:Tachycardia; Risk                 Factors:Hypertension.  Sonographer:    Vern Claude Referring Phys: [id number] JEFFREY C HATCHER IMPRESSIONS  1. Left ventricular ejection fraction, by estimation, is 55 to 60%. The left ventricle has normal function. The left ventricle has no regional wall motion abnormalities. Indeterminate diastolic filling due to E-A fusion.  2. Right ventricular systolic function is normal. The right ventricular size is normal. Tricuspid regurgitation signal is inadequate for assessing PA pressure.  3. The mitral valve is grossly normal. Trivial mitral valve regurgitation. No evidence of mitral stenosis.  4. The aortic valve is normal in structure. Aortic valve regurgitation is not visualized. No aortic stenosis is present.  5. The inferior vena cava is normal in size with greater than 50% respiratory variability, suggesting right atrial pressure of 3 mmHg. Conclusion(s)/Recommendation(s): No evidence of valvular vegetations on this transthoracic echocardiogram. Consider a transesophageal echocardiogram to  exclude infective endocarditis if clinically indicated. FINDINGS  Left Ventricle: Left ventricular ejection fraction, by estimation, is 55 to 60%. The left ventricle has normal function. The left ventricle has no regional wall motion abnormalities. The left ventricular internal cavity size was normal in size. There is  no left ventricular hypertrophy. Indeterminate diastolic filling due to E-A fusion. Right Ventricle: The right ventricular size is normal. No increase in right ventricular wall thickness. Right ventricular systolic function is normal. Tricuspid regurgitation signal is inadequate for assessing PA pressure. Left Atrium: Left atrial size was normal in size. Right Atrium: Right atrial size was normal in size. Pericardium: There is no evidence of pericardial effusion. Mitral Valve: The mitral valve is grossly normal. Trivial mitral valve regurgitation. No evidence of mitral valve stenosis. MV peak gradient, 2.7 mmHg. The mean mitral valve gradient is 2.0 mmHg. Tricuspid Valve: The tricuspid valve is normal in structure. Tricuspid valve regurgitation is mild . No evidence of tricuspid stenosis. Aortic Valve: The aortic valve is normal in structure. Aortic valve regurgitation is not visualized. No aortic stenosis is present. Aortic valve mean gradient measures 2.0 mmHg. Aortic valve peak gradient measures 4.4 mmHg. Aortic valve area, by VTI measures 2.82 cm. Pulmonic Valve: The pulmonic valve was not well visualized. Pulmonic valve regurgitation is trivial. No evidence of pulmonic stenosis. Aorta: The aortic root is normal in size and structure. Venous: The inferior vena cava is normal in size with greater than 50% respiratory variability, suggesting right atrial pressure of 3 mmHg. IAS/Shunts: No atrial level shunt detected by color flow Doppler.  LEFT VENTRICLE PLAX 2D LVIDd:         4.10 cm      Diastology LVIDs:         2.50 cm      LV e' medial:    14.80 cm/s LV PW:         0.60 cm      LV E/e' medial:   4.7 LV IVS:        0.80 cm      LV e' lateral:   19.00 cm/s LVOT diam:     1.80 cm      LV E/e' lateral: 3.7 LV SV:         50 LV SV Index:   27 LVOT Area:  2.54 cm  LV Volumes (MOD) LV vol d, MOD A2C: 153.0 ml LV vol d, MOD A4C: 159.0 ml LV vol s, MOD A2C: 43.3 ml LV vol s, MOD A4C: 48.9 ml LV SV MOD A2C:     109.7 ml LV SV MOD A4C:     159.0 ml LV SV MOD BP:      115.3 ml RIGHT VENTRICLE             IVC RV Basal diam:  3.20 cm     IVC diam: 1.20 cm RV Mid diam:    1.90 cm RV S prime:     18.50 cm/s TAPSE (M-mode): 3.0 cm LEFT ATRIUM           Index        RIGHT ATRIUM           Index LA diam:      3.00 cm 1.61 cm/m   RA Area:     13.30 cm LA Vol (A2C): 29.2 ml 15.65 ml/m  RA Volume:   29.90 ml  16.02 ml/m LA Vol (A4C): 20.6 ml 11.04 ml/m  AORTIC VALVE                    PULMONIC VALVE AV Area (Vmax):    2.42 cm     PV Vmax:       1.02 m/s AV Area (Vmean):   2.48 cm     PV Peak grad:  4.1 mmHg AV Area (VTI):     2.82 cm AV Vmax:           105.00 cm/s AV Vmean:          68.200 cm/s AV VTI:            0.177 m AV Peak Grad:      4.4 mmHg AV Mean Grad:      2.0 mmHg LVOT Vmax:         99.70 cm/s LVOT Vmean:        66.400 cm/s LVOT VTI:          0.196 m LVOT/AV VTI ratio: 1.11  AORTA Ao Root diam: 3.00 cm Ao Asc diam:  2.20 cm MITRAL VALVE MV Area (PHT): 4.68 cm    SHUNTS MV Area VTI:   3.43 cm    Systemic VTI:  0.20 m MV Peak grad:  2.7 mmHg    Systemic Diam: 1.80 cm MV Mean grad:  2.0 mmHg MV Vmax:       0.82 m/s MV Vmean:      65.4 cm/s MV Decel Time: 162 msec MV E velocity: 70.20 cm/s MV A velocity: 65.50 cm/s MV E/A ratio:  1.07 Weston Brass MD Electronically signed by Weston Brass MD Signature Date/Time: 01/26/2024/2:38:27 PM    Final        Assessment / Plan:    #36 28 year old male with prior history of heavy EtOH use for several years now abstinent over the past year and a half.  Prior episodes of pancreatitis including 1 prolonged episode in 2022 at which time he was hospitalized at  [facility]. He says he has not had any episodes of pancreatitis since that time.  Onset of current symptoms about a week prior to hospitalization, with progressive pain to the point that he could not tolerate, associated with decreased p.o. intake and increased pain with eating.  CT of the abdomen pelvis shows some degree of chronic pancreatitis with calcifications in the pancreatic tail though  no main pancreatic duct dilation to suggest stricture, there are 2 fluid collections consistent with pancreatic pseudocysts, 1 is perinephric and the other is a subcapsular fluid collection along the spleen/left upper quadrant-these appear to be communicating-no gas present to suggest infected pseudocyst  IR appropriately does not feel that these should be drained or sampled at this time percutaneously.  There has been some concern about possible infected pseudocyst as patient did have some fever on admission and elevated white count.'s have normalized. He is currently being continued on antibiotics as above  We do not know the duration of these pseudocysts and whether these are subacute and may have acutely enlarged and/or possibly becoming infected versus new pseudocysts occurring just over the past couple of weeks.  Plan; diet as he can tolerate Adequate pain control Continue current antibiotics As long as he remains stable, pain does not progress, goal will be to manage him conservatively for now, and plan to reimage in a few weeks with either follow-up CT or MRI. These pseudocysts are not likely mature enough to be drained endoscopically at this time, but if necessary this may be able to be accomplished in the future. Surgery in the future is also possibility though not indicated at present. GI will continue to follow with you.    Principal Problem:   Acute on chronic pancreatitis (HCC) Active Problems:   GERD (gastroesophageal reflux disease)   Pancreatic pseudocyst   Abdominal pain, left  upper quadrant     LOS: 2 days   Amy Esterwood PA-C 01/27/2024, 5:49 PM    I have taken an interval history, thoroughly reviewed the chart and examined the patient. I agree with the Advanced Practitioner's note, impression and recommendations, and have recorded additional findings, impressions and recommendations below. I performed a substantive portion of this encounter (>50% time spent), including a complete performance of the medical decision making.  My additional thoughts are as follows:  Slowly improving, any infection that may have been present now seems under control.  Negative blood cultures. Oral intake still fair with some nausea and pain, which seem to occur after he advanced to a solid food diet.  See my note from yesterday for further details about this. Currently continuing conservative care in hopes that these cysts of unknown duration that may have been infected will remain stable and allow outpatient radiographic follow-up to see if they require endoscopic or surgical management.   Charlie Pitter III [phone]

## 2024-01-27 NOTE — Progress Notes (Signed)
 Chart reviewed.  Our team was following for CT findings of LUQ inflammatory changes w/ Chronic pancreatitis with a 7.5 x 7 cm perinephric loculated fluid collection arising from the pancreatic tail leads to mass effect on the interpolar region of the left kidney along with similar finding along the subdiaphragmatic splenic capsule measuring up to 7 x 2 cm. Radiology felt these are likely contiguous with one another. IR reviewed imaging and felt this is more c/w delineated pseudocysts extending from the pancreatic tail and extending towards the left kidney.  IR held off on drainage as percutanous drainage of  pseudocysts can often develop into fistulas to the skin from drain tracts. Agree w/ holding off.  GI has seen for Chronic pancreatitis with pseudocysts. They do not plan EUS inpt. Recommend treating conservatively. They plan for repeat imaging in several weeks to see if there is any interval change that might require evaluation with EUS.  I tried to stop by patient room x 2 today to check on him/see him but pt was unavailable (in bathroom).  On chart he is HDS. Afebrile. Tachycardia resolved. No hypotension. WBC wnl.  Reviewed case w/ attending. We will sign off and defer further tx to GI. Abx per primary and GI.  Etiology of pancreatitis felt to be 2/2 etoh per GI note. No gallstones noted on CT during this admission. He did have sludge on RUQ [place] back in 2020. Pt will need pancreatitis w/ pseudocyst addressed before f/u with [place] for any discussions of lap chole and will defer timing/referral to GI as they will see him as an outpatient.  I called and discussed with primary team who felt comfortable with this plan. I also called and discussed with GI who felt comfortable with this plan.  Our team will sign off. Please call us back with any questions or concerns.   Jacinto Halim, PA-C

## 2024-01-28 ENCOUNTER — Other Ambulatory Visit (HOSPITAL_COMMUNITY): Payer: Self-pay

## 2024-01-28 LAB — BASIC METABOLIC PANEL WITH GFR
Anion gap: 12 (ref 5–15)
BUN: 7 mg/dL (ref 6–20)
CO2: 28 mmol/L (ref 22–32)
Calcium: 9 mg/dL (ref 8.9–10.3)
Chloride: 96 mmol/L — ABNORMAL LOW (ref 98–111)
Creatinine, Ser: 0.84 mg/dL (ref 0.61–1.24)
GFR, Estimated: >60 mL/min (ref >60)
Glucose, Bld: 92 mg/dL (ref 70–99)
Potassium: 3.7 mmol/L (ref 3.5–5.1)
Sodium: 136 mmol/L (ref 135–145)

## 2024-01-28 LAB — CBC
HCT: 39 % (ref 39.0–52.0)
Hemoglobin: 13.2 g/dL (ref 13.0–17.0)
MCH: 31.8 pg (ref 26.0–34.0)
MCHC: 33.8 g/dL (ref 30.0–36.0)
MCV: 94 fL (ref 80.0–100.0)
Platelets: 415 10*3/uL — ABNORMAL HIGH (ref 150–400)
RBC: 4.15 MIL/uL — ABNORMAL LOW (ref 4.22–5.81)
RDW: 12.3 % (ref 11.5–15.5)
WBC: 7.3 10*3/uL (ref 4.0–10.5)
nRBC: 0 % (ref 0.0–0.2)

## 2024-01-28 MED ORDER — ONDANSETRON HCL 4 MG PO TABS
4.0000 mg | ORAL_TABLET | ORAL | 0 refills | Status: AC | PRN
  Filled 2024-01-28: qty 20, 5d supply, fill #0

## 2024-01-28 MED ORDER — OXYCODONE HCL 10 MG PO TABS
10.0000 mg | ORAL_TABLET | Freq: Four times a day (QID) | ORAL | 0 refills | Status: AC | PRN
Start: 2024-01-28 — End: 2024-02-02
  Filled 2024-01-28: qty 20, 5d supply, fill #0

## 2024-01-28 MED ORDER — CIPROFLOXACIN HCL 500 MG PO TABS
500.0000 mg | ORAL_TABLET | Freq: Two times a day (BID) | ORAL | 0 refills | Status: AC
Start: 2024-01-29 — End: 2024-02-01
  Filled 2024-01-28: qty 5, 3d supply, fill #0

## 2024-01-28 MED ORDER — ACETAMINOPHEN 500 MG PO TABS
1000.0000 mg | ORAL_TABLET | Freq: Three times a day (TID) | ORAL | 0 refills | Status: AC
  Filled 2024-01-28: qty 30, 5d supply, fill #0

## 2024-01-28 MED ORDER — METRONIDAZOLE 500 MG PO TABS
500.0000 mg | ORAL_TABLET | Freq: Two times a day (BID) | ORAL | 0 refills | Status: AC
  Filled 2024-01-28: qty 5, 3d supply, fill #0

## 2024-01-28 NOTE — Plan of Care (Signed)
   Problem: Health Behavior/Discharge Planning: Goal: Ability to manage health-related needs will improve Outcome: Progressing   Problem: Clinical Measurements: Goal: Will remain free from infection Outcome: Progressing   Problem: Activity: Goal: Risk for activity intolerance will decrease Outcome: Progressing   Problem: Nutrition: Goal: Adequate nutrition will be maintained Outcome: Progressing   Problem: Coping: Goal: Level of anxiety will decrease Outcome: Progressing

## 2024-01-28 NOTE — Discharge Instructions (Signed)
 You were hospitalized for abdominal pain. Thank you for allowing [place] to be part of your care.   We arranged for you to follow up at:  - [facility] on 4/15 - GI doctors office will call you to make a follow-up appointment.    Please note these changes made to your medications:   Please START taking:  - Tylenol 1000 mg 3 times a day for 5 days - Oxycodone  10 mg every 6 hours PRN for 5 days  - Ondansetron 4 mg tablet PRN for nausea.   Please make sure to return to the hospital if you have worsening abdominal pain, persistent nausea/vomiting.  Please call our clinic if you have any questions or concerns, we may be able to help and keep you from a long and expensive emergency room wait. Our clinic and after hours phone number is [phone], the best time to call is Monday through Friday 9 am to 4 pm but there is always someone available 24/7 if you have an emergency. If you need medication refills please notify your pharmacy one week in advance and they will send [place] a request.     - Dr. Carron Brazen

## 2024-01-28 NOTE — Progress Notes (Signed)
 Epic msg sent to [facility] team. Patient states he needs to leave by 10:30 am, and he is leaving today regardless. Patient is frustrated feels he is getting conflicting info and isn't sure what is going on. Also once patient is d/c he will need a work note for this visit.

## 2024-01-28 NOTE — Discharge Summary (Addendum)
 Name: Joshua Morton MRN: [id number] DOB: September 28, 1996 28 y.o. PCP: Patient, No Pcp Per  Date of Admission: 01/24/2024  4:50 AM Date of Discharge: 01/28/2024 Attending Physician: Dr. Sol Morton  Discharge Diagnosis: Principal Problem:   Acute on chronic pancreatitis [facility]) Active Problems:   GERD (gastroesophageal reflux disease)   Pancreatic pseudocyst   Abdominal pain, left upper quadrant    Discharge Medications: Allergies as of 01/28/2024   No Known Allergies      Medication List     STOP taking these medications    meloxicam 15 MG tablet Commonly known as: Mobic       TAKE these medications    acetaminophen 500 MG tablet Commonly known as: TYLENOL Take 2 tablets (1,000 mg total) by mouth 3 (three) times daily. What changed:  how much to take when to take this reasons to take this   ciprofloxacin 500 MG tablet Commonly known as: CIPRO Take 1 tablet (500 mg total) by mouth 2 (two) times daily for 3 days. Start taking on: January 29, 2024   cyclobenzaprine 5 MG tablet Commonly known as: FLEXERIL Take 1-2 tablets (5-10 mg total) by mouth 3 (three) times daily as needed for muscle spasms.   metroNIDAZOLE 500 MG tablet Commonly known as: FLAGYL Take 1 tablet (500 mg total) by mouth every 12 (twelve) hours.   ondansetron 4 MG tablet Commonly known as: ZOFRAN Take 1 tablet (4 mg total) by mouth as needed for nausea or vomiting.   Oxycodone HCl 10 MG Tabs Take 1 tablet (10 mg total) by mouth every 6 (six) hours as needed for up to 5 days for moderate pain (pain score 4-6) or severe pain (pain score 7-10).        Disposition and follow-up:   Mr.Joshua Morton was discharged from [facility] in [place] condition.  At the hospital follow up visit please address:  1.  Follow-up:  a.  Abdominal pain CT showed loculated pancreatic pseudocyst  and perisplenic abscess.  No surgical or drainage intervention during admission. Interventional  radiology, general surgery, and GI consulted, recommended conservative treatment. Completing 2 days of p.o. antibiotics outpatient. - Please follow-up on completion of antibiotics. - Follow-up on abdominal pain - Advance diet as tolerated. - GI follow-up in 02/2024.    - Repeat CT abdomen/pelvis with contrast in 4 weeks.   2.  Labs / imaging needed at time of follow-up: None   3.  Pending labs/ test needing follow-up: cbc,bmp  Hospital Course by problem list:   Pseudocysts, pancreatic tail and splenic capsule  Acute on chronic pancreatitis    Leukocytosis  Initially presented with chest pain and epigastric pain.  Afebrile, WBC elevated 12.6 , mild lipase elevation. CT A/P showed perisplenic abscess and pancreatic pseudocyst tracking near the left kidney.   General surgery, IR and GI were consulted, not recommending drainage or sampling of the fluid. Fever on HD#2. Treated with antibiotics for 7 day course. By day of discharge, patient was tolerating soft diet and abdominal pain has improved. He does not have a PCP, amenable with following with [facility] resident clinic.  - Ciprofloxacin 500 mg twice daily - Flagyl 500 mg twice daily - CT and GI follow up in 4 weeks - Oxycodone 10 mg Q6hrs prn - Tylenol 1000 mg TID - Zofran 4 mg PRN      Moderate Left Pleural effusion  Chest pain, MSK likely Noted on CT a/p, no shortness of breath, On room air,  saturating > 94 %. D dimer elevated 2.76, likely in the setting of above. CTA negative for PE.  CP likely due to MSK/ referred visceral pain,  - Pain management as per above - Lidocaine patches   Hx of Alcohol Use - No Alcohol use for about 3 years.     Discharge Subjective: Patient evaluated at bedside this AM. Continues to have pain but improving. Diet advancing. He feels ready to return home.  Discharge Exam:   BP 116/87 (BP Location: Left Arm)   Pulse 93   Temp 98.3 F (36.8 C) (Oral)   Resp 18   Ht 5\' 11"  (1.803 m)   Wt  68 kg   SpO2 97%   BMI 20.92 kg/m   Well-appearing, NAD Heart with regular rate and rhythm. Abdomen mildly tender in left upper quadrant, no signs of peritonitis.  Pertinent Labs, Studies, and Procedures:     Latest Ref Rng & Units 01/28/2024    8:34 AM 01/27/2024    6:50 AM 01/26/2024    7:36 AM  CBC  WBC 4.0 - 10.5 K/uL 7.3  8.7  9.9   Hemoglobin 13.0 - 17.0 g/dL 16.1  09.6  04.5   Hematocrit 39.0 - 52.0 % 39.0  40.3  38.7   Platelets 150 - 400 K/uL 415  370  348        Latest Ref Rng & Units 01/27/2024    6:50 AM 01/26/2024    7:36 AM 01/25/2024    4:59 AM  CMP  Glucose 70 - 99 mg/dL 85  83  89   BUN 6 - 20 mg/dL 9  8  12    Creatinine 0.61 - 1.24 mg/dL 4.09  8.11  9.14   Sodium 135 - 145 mmol/L 135  138  135   Potassium 3.5 - 5.1 mmol/L 4.4  4.0  4.4   Chloride 98 - 111 mmol/L 97  98  98   CO2 22 - 32 mmol/L 26  29  28    Calcium 8.9 - 10.3 mg/dL 8.9  8.6  8.4   Total Protein 6.5 - 8.1 g/dL  6.2  5.4   Total Bilirubin 0.0 - 1.2 mg/dL  0.9  0.9   Alkaline Phos 38 - 126 U/L  48  47   AST 15 - 41 U/L  12  14   ALT 0 - 44 U/L  20  25     CT ABDOMEN PELVIS W CONTRAST Result Date: 01/24/2024 CLINICAL DATA:  Abdominal pain, acute, nonlocalized EXAM: CT ABDOMEN AND PELVIS WITH CONTRAST TECHNIQUE: Multidetector CT imaging of the abdomen and pelvis was performed using the standard protocol following bolus administration of intravenous contrast. RADIATION DOSE REDUCTION: This exam was performed according to the departmental dose-optimization program which includes automated exposure control, adjustment of the mA and/or kV according to patient size and/or use of iterative reconstruction technique. CONTRAST:  75mL OMNIPAQUE IOHEXOL 350 MG/ML SOLN COMPARISON:  CT angiography chest 01/24/2024, CT abdomen pelvis 03/15/2021 FINDINGS: Lower chest: Left pleural effusion. Please see separately dictated CT angiography chest 01/24/2024. Hepatobiliary: No focal liver abnormality. No gallstones, gallbladder  wall thickening, or pericholecystic fluid. No biliary dilatation. Pancreas: Interval development of a lobulated peripherally enhancing perinephric fluid density lesion that is likely arising from the pancreatic tail and measuring up to 7.5 x 7 cm (best evaluated on coronal imaging, 6:25; 7:91)). Finding appears inseparable from the pancreatic tail and kidney. Coarse calcifications along the pancreatic tail may represent chronic pancreatitis. No main  pancreatic ductal dilatation. Spleen: Spleen is normal in size. There is a subcapsular fluid density lesion along the spleen/left upper quadrant measuring up to 6.9 x 1.9 cm (6: 32-44). Adrenals/Urinary Tract: No adrenal nodule bilaterally. Bilateral kidneys enhance and excrete symmetrically. Mass effect on the left interpolar region of the kidney from perinephric fluid collection as described in the pancreatic section. No hydronephrosis. No hydroureter.  No nephroureterolithiasis. The urinary bladder is unremarkable. Stomach/Bowel: Question greater curvature of the stomach bowel wall thickening and hazy contour (6:52). No evidence of bowel wall thickening or dilatation. Appendix appears normal. Vascular/Lymphatic: No abdominal aorta or iliac aneurysm. Mild atherosclerotic plaque of the aorta and its branches. No abdominal, pelvic, or inguinal lymphadenopathy. Reproductive: Prostate is unremarkable. Other: Trace volume left upper quadrant free fluid that measures slightly higher than simple fluid. No intraperitoneal free gas. The perinephric/arising from the pancreas and perisplenic/subdiaphragmatic fluid collections are likely contiguous with one another (6:36). Musculoskeletal: No chest wall abnormality. No suspicious lytic or blastic osseous lesions. No acute displaced fracture. IMPRESSION: 1. Left upper quadrant inflammatory changes with associated abscesses as below. 2. Chronic pancreatitis with underlying acute pancreatitis not excluded, recommend correlation with  lipase levels. 3. A 7.5 x 7 cm perinephric loculated likely infected pseudocyst/abscess arising from the pancreatic tail leads to mass effect on the interpolar region of the left kidney. Similar finding along the subdiaphragmatic splenic capsule measuring up to 7 x 2 cm. The abscesses are likely contiguous with one another. 4. Gastric greater curvature irregular contour and possible thickened wall may represent reactive changes. 5. Trace volume left upper quadrant complex free fluid that could represent infected fluid versus blood products. 6. Left pleural effusion. Please see separately dictated CT angiography chest 01/24/2024. Electronically Signed   By: Tish Frederickson M.D.   On: 01/24/2024 14:29   CT Angio Chest PE W and/or Wo Contrast Result Date: 01/24/2024 CLINICAL DATA:  Acute left-sided chest pain and shortness of breath. EXAM: CT ANGIOGRAPHY CHEST WITH CONTRAST TECHNIQUE: Multidetector CT imaging of the chest was performed using the standard protocol during bolus administration of intravenous contrast. Multiplanar CT image reconstructions and MIPs were obtained to evaluate the vascular anatomy. RADIATION DOSE REDUCTION: This exam was performed according to the departmental dose-optimization program which includes automated exposure control, adjustment of the mA and/or kV according to patient size and/or use of iterative reconstruction technique. CONTRAST:  75mL OMNIPAQUE IOHEXOL 350 MG/ML SOLN COMPARISON:  09/04/2019 FINDINGS: Cardiovascular: Satisfactory opacification of pulmonary arteries noted, and no pulmonary emboli identified. No evidence of thoracic aortic dissection or aneurysm. Mediastinum/Nodes: No masses or pathologically enlarged lymph nodes identified. Lungs/Pleura: Moderate left pleural effusion is seen with left lower lobe atelectasis. Upper abdomen: Several small perisplenic and left Peri renal fluid collections are noted, and are of uncertain etiology. Musculoskeletal: No suspicious bone  lesions identified. Review of the MIP images confirms the above findings. IMPRESSION: No evidence of pulmonary embolism. Moderate left pleural effusion with left lower lobe atelectasis. Several small perisplenic and left perinephric fluid collections, of uncertain etiology. Consider further evaluation with abdomen and pelvis CT with contrast. Electronically Signed   By: Danae Orleans M.D.   On: 01/24/2024 11:59   DG Chest 2 View Result Date: 01/24/2024 CLINICAL DATA:  28 year old male with left chest pain. EXAM: CHEST - 2 VIEW COMPARISON:  Chest radiographs 01/19/2022 and earlier. FINDINGS: AP and lateral views 0509 hours. Moderate opacification of the left lung base which appears at least in part related to pleural effusion. No air bronchograms  are visible. Underlying mediastinal contours seem to remain normal. Visualized tracheal air column is within normal limits. No pulmonary edema. No pneumothorax. Right lung appears negative. No osseous abnormality identified.  Negative visible bowel gas. IMPRESSION: Opacified left lung base which is at least in part related to a left pleural effusion (perhaps moderate size). Underlying consolidation/pneumonia not excluded. Right lung appears negative. Electronically Signed   By: Odessa Fleming M.D.   On: 01/24/2024 05:43     Discharge Instructions: Discharge Instructions     Call MD for:  severe uncontrolled pain   Complete by: As directed    Call MD for:  temperature >100.4   Complete by: As directed    Diet - low sodium heart healthy   Complete by: As directed    Increase activity slowly   Complete by: As directed        Signed: Laretta Bolster, MD 01/28/2024, 9:15 AM   Pager: [phone]

## 2024-01-30 LAB — CULTURE, BLOOD (ROUTINE X 2)
Culture: NO GROWTH
Culture: NO GROWTH

## 2024-02-03 ENCOUNTER — Encounter: Admitting: Internal Medicine

## 2024-02-03 NOTE — Progress Notes (Deleted)
 PMH   PSH   MEDS   ALL   [facility]   [facility]   Patient recently admitted with acute on chronic pancreatitis with pancreatic pseudocyst complicated by perisplenic abscess which did not receive intervention while hospitalized. He was discharged on ciprofloxacin 500 mg BID and metronidazole 600 mg BID, end date of ***, in addition to pain medication and antiemetics. He has been recommended to have a follow-up CT abdomen/pelvis around the beginning of May. He has a GI follow-up set for May.  He is eating and drinking well***.

## 2024-02-04 ENCOUNTER — Telehealth: Payer: Self-pay | Admitting: General Practice

## 2024-02-04 NOTE — Telephone Encounter (Signed)
 No restrictions were made to sch this appt per notes below.  Spoke with the patient and he has resch his appt to:  Name: Joshua Morton, Joshua Morton MRN: [id number]  Date: 02/12/2024 Status: Sch  Time: 3:45 PM Length: 30  Visit Type: OPEN ESTABLISHED [726] Copay: $50.00  Provider: Malen Scudder, DO      Copied from CRM [id number]. Topic: Appointments - Scheduling Inquiry for Clinic >> Feb 04, 2024  1:07 PM Corin V wrote: Reason for CRM: Patient's friend, Sherline Distel, called on behalf of patient to reschedule a hospital follow up that he had missed. It looks like [facility] had overrode restrictions to schedule. Please call Sherline Distel back to reschedule at [phone]

## 2024-02-12 ENCOUNTER — Encounter: Admitting: Internal Medicine

## 2024-02-26 ENCOUNTER — Ambulatory Visit: Payer: Self-pay

## 2024-03-18 ENCOUNTER — Other Ambulatory Visit: Payer: Self-pay

## 2024-03-18 ENCOUNTER — Emergency Department (HOSPITAL_BASED_OUTPATIENT_CLINIC_OR_DEPARTMENT_OTHER)
Admission: EM | Admit: 2024-03-18 | Discharge: 2024-03-18 | Disposition: A | Discharge status: Home / Self Care | Attending: Emergency Medicine | Admitting: Emergency Medicine

## 2024-03-18 ENCOUNTER — Emergency Department (HOSPITAL_BASED_OUTPATIENT_CLINIC_OR_DEPARTMENT_OTHER)

## 2024-03-18 ENCOUNTER — Encounter (HOSPITAL_BASED_OUTPATIENT_CLINIC_OR_DEPARTMENT_OTHER): Payer: Self-pay | Admitting: Emergency Medicine

## 2024-03-18 DIAGNOSIS — R1013 Epigastric pain: Secondary | ICD-10-CM | POA: Diagnosis present

## 2024-03-18 DIAGNOSIS — Z87891 Personal history of nicotine dependence: Secondary | ICD-10-CM | POA: Insufficient documentation

## 2024-03-18 DIAGNOSIS — R7989 Other specified abnormal findings of blood chemistry: Secondary | ICD-10-CM | POA: Insufficient documentation

## 2024-03-18 DIAGNOSIS — I10 Essential (primary) hypertension: Secondary | ICD-10-CM | POA: Diagnosis not present

## 2024-03-18 DIAGNOSIS — K861 Other chronic pancreatitis: Secondary | ICD-10-CM | POA: Insufficient documentation

## 2024-03-18 LAB — CBC WITH DIFFERENTIAL/PLATELET
Abs Immature Granulocytes: 0.03 10*3/uL (ref 0.00–0.07)
Basophils Absolute: 0 10*3/uL (ref 0.0–0.1)
Basophils Relative: 1 %
Eosinophils Absolute: 0.3 10*3/uL (ref 0.0–0.5)
Eosinophils Relative: 5 %
HCT: 39.5 % (ref 39.0–52.0)
Hemoglobin: 13.3 g/dL (ref 13.0–17.0)
Immature Granulocytes: 1 %
Lymphocytes Relative: 18 %
Lymphs Abs: 1.1 10*3/uL (ref 0.7–4.0)
MCH: 32 pg (ref 26.0–34.0)
MCHC: 33.7 g/dL (ref 30.0–36.0)
MCV: 95 fL (ref 80.0–100.0)
Monocytes Absolute: 0.6 10*3/uL (ref 0.1–1.0)
Monocytes Relative: 10 %
Neutro Abs: 4.1 10*3/uL (ref 1.7–7.7)
Neutrophils Relative %: 65 %
Platelets: 309 10*3/uL (ref 150–400)
RBC: 4.16 MIL/uL — ABNORMAL LOW (ref 4.22–5.81)
RDW: 13.7 % (ref 11.5–15.5)
WBC: 6.2 10*3/uL (ref 4.0–10.5)
nRBC: 0 % (ref 0.0–0.2)

## 2024-03-18 LAB — URINALYSIS, ROUTINE W REFLEX MICROSCOPIC
Bilirubin Urine: NEGATIVE
Glucose, UA: NEGATIVE mg/dL
Hgb urine dipstick: NEGATIVE
Ketones, ur: NEGATIVE mg/dL
Leukocytes,Ua: NEGATIVE
Nitrite: NEGATIVE
Protein, ur: NEGATIVE mg/dL
Specific Gravity, Urine: 1.025 (ref 1.005–1.030)
pH: 7 (ref 5.0–8.0)

## 2024-03-18 LAB — COMPREHENSIVE METABOLIC PANEL WITH GFR
ALT: 12 U/L (ref 0–44)
AST: 22 U/L (ref 15–41)
Albumin: 4 g/dL (ref 3.5–5.0)
Alkaline Phosphatase: 66 U/L (ref 38–126)
Anion gap: 11 (ref 5–15)
BUN: 19 mg/dL (ref 6–20)
CO2: 25 mmol/L (ref 22–32)
Calcium: 9 mg/dL (ref 8.9–10.3)
Chloride: 104 mmol/L (ref 98–111)
Creatinine, Ser: 0.92 mg/dL (ref 0.61–1.24)
GFR, Estimated: >60 mL/min (ref >60)
Glucose, Bld: 97 mg/dL (ref 70–99)
Potassium: 4.1 mmol/L (ref 3.5–5.1)
Sodium: 140 mmol/L (ref 135–145)
Total Bilirubin: 0.4 mg/dL (ref 0.0–1.2)
Total Protein: 7.1 g/dL (ref 6.5–8.1)

## 2024-03-18 LAB — LIPASE, BLOOD: Lipase: 88 U/L — ABNORMAL HIGH (ref 11–51)

## 2024-03-18 MED ORDER — SODIUM CHLORIDE 0.9 % IV BOLUS
1000.0000 mL | Freq: Once | INTRAVENOUS | Status: AC
  Administered 2024-03-18: 1000 mL via INTRAVENOUS

## 2024-03-18 MED ORDER — FAMOTIDINE IN NACL 20-0.9 MG/50ML-% IV SOLN
20.0000 mg | Freq: Once | INTRAVENOUS | Status: AC
  Administered 2024-03-18: 20 mg via INTRAVENOUS
  Filled 2024-03-18: qty 50

## 2024-03-18 MED ORDER — ALUM & MAG HYDROXIDE-SIMETH 200-200-20 MG/5ML PO SUSP
15.0000 mL | Freq: Once | ORAL | Status: AC
  Administered 2024-03-18: 15 mL via ORAL
  Filled 2024-03-18: qty 30

## 2024-03-18 MED ORDER — SUCRALFATE 1 G PO TABS: 1.0000 g | ORAL_TABLET | Freq: Three times a day (TID) | ORAL | 0 refills | Status: DC

## 2024-03-18 MED ORDER — PANTOPRAZOLE SODIUM 40 MG PO TBEC: 40.0000 mg | DELAYED_RELEASE_TABLET | Freq: Every day | ORAL | 0 refills | Status: DC

## 2024-03-18 MED ORDER — OXYCODONE HCL 5 MG PO TABS: 5.0000 mg | ORAL_TABLET | ORAL | 0 refills | Status: AC | PRN

## 2024-03-18 MED ORDER — MORPHINE SULFATE (PF) 4 MG/ML IV SOLN: 4.0000 mg | Freq: Once | INTRAVENOUS | Status: DC

## 2024-03-18 MED ORDER — IOHEXOL 300 MG/ML  SOLN
100.0000 mL | Freq: Once | INTRAMUSCULAR | Status: AC | PRN
  Administered 2024-03-18: 100 mL via INTRAVENOUS

## 2024-03-18 MED ORDER — HYDROMORPHONE HCL 1 MG/ML IJ SOLN
1.0000 mg | Freq: Once | INTRAMUSCULAR | Status: AC
  Administered 2024-03-18: 1 mg via INTRAVENOUS
  Filled 2024-03-18: qty 1

## 2024-03-18 MED ORDER — ONDANSETRON HCL 4 MG/2ML IJ SOLN
4.0000 mg | Freq: Once | INTRAMUSCULAR | Status: AC
  Administered 2024-03-18: 4 mg via INTRAVENOUS
  Filled 2024-03-18: qty 2

## 2024-03-18 NOTE — ED Triage Notes (Addendum)
 Pt arrives with c/o upper ABD pain that started today. Pt has hx of pancreatitis and pancreatitic cyst. Pt has treated pain with tylenol  and ibuprofen  without relief. Per pt, pain radiates up into his chest. Pt endorses chills.

## 2024-03-18 NOTE — ED Provider Notes (Signed)
 [place] [facility] Provider Note  CSN: [id number] Arrival date & time: 03/18/24 1826  Chief Complaint(s) Abdominal Pain  HPI Joshua Morton is a 28 y.o. male with past medical history as below, significant for prior etoh abuse now sober, chronic pancreatitis, GERD, htn who presents to the ED with complaint of epig / abd pain  Began last night, felt like exacerbation of his prior pancreatitis. Felt hot/sweaty, epigastric pain. He took a hot shower and symptoms abated last night. Returned today, similar to prior. Nausea w/o vomiting, no cp or dib. He took apap/ibuprofen  without much relief.   No change to bowel/bladder function, no vomiting, no recent etoh. Does not f/w pcp  Past Medical History Past Medical History:  Diagnosis Date   Acute pancreatitis 09/04/2019   Acute respiratory failure with hypoxia (HCC)    Attempted suicide (HCC)    GERD (gastroesophageal reflux disease) 09/04/2019   Pancreatitis, alcoholic, acute 09/04/2019   Patient Active Problem List   Diagnosis Date Noted   Abdominal pain, left upper quadrant 01/26/2024   Acute on chronic pancreatitis (HCC) 01/24/2024   Marijuana abuse 01/20/2022   Pancreatic pseudocyst 01/02/2022   Alcoholic pancreatitis 03/15/2021   Panic disorder 12/09/2019   Abdominal pain 12/08/2019   Essential hypertension    Tachycardia    Fatty liver 09/04/2019   GERD (gastroesophageal reflux disease) 09/04/2019   Procedure and treatment not carried out because of patient's decision for other reasons 06/26/2012   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 Authorizing Provider  oxyCODONE  (ROXICODONE ) 5 MG immediate release tablet Take 1 tablet (5 mg total) by mouth every 4 (four) hours as needed for severe pain (pain score 7-10). 03/18/24   Yes Russella Courts A, DO  pantoprazole  (PROTONIX ) 40 MG tablet Take 1 tablet (40 mg total) by mouth daily for 14 days. 03/18/24 04/01/24 Yes Russella Courts A, DO  sucralfate (CARAFATE) 1 g tablet Take 1 tablet (1 g total) by mouth with breakfast, with lunch, and with evening meal for 7 days. 03/18/24 03/25/24 Yes Teddi Favors, DO  acetaminophen  (TYLENOL ) 500 MG tablet Take 2 tablets (1,000 mg total) by mouth 3 (three) times daily. 01/28/24   Marni Sins, MD  cyclobenzaprine  (FLEXERIL ) 5 MG tablet Take 1-2 tablets (5-10 mg total) by mouth 3 (three) times daily as needed for muscle spasms. 01/21/24   Brimage, Vondra, DO  metroNIDAZOLE  (FLAGYL ) 500 MG tablet Take 1 tablet (500 mg total) by mouth every 12 (twelve) hours. 01/28/24   Marni Sins, MD  ondansetron  (ZOFRAN ) 4 MG tablet Take 1 tablet (4 mg total) by mouth as needed for nausea or vomiting. 01/28/24   Marni Sins, MD  Past Surgical History Past Surgical History:  Procedure Laterality Date   IR GASTR TUBE CONVERT GASTR-JEJ PER W/FL MOD SED     Family History Family History  Problem Relation Age of Onset   Hypertension Mother     Social History Social History   Tobacco Use   Smoking status: Former    Current packs/day: 0.10    Types: Cigarettes   Smokeless tobacco: Current   Tobacco comments:    vapes enough for about 1-2 cigarettes/day  Vaping Use   Vaping status: Every Day   Substances: Nicotine, Flavoring  Substance Use Topics   Alcohol use: Not Currently    Comment: occ   Drug use: Yes    Frequency: 3.0 times per week    Types: Marijuana   Allergies Patient has no known allergies.  Review of Systems A thorough review of systems was obtained and all systems are negative except as noted in the HPI and PMH.   Physical Exam Vital Signs  I have reviewed the triage vital signs BP (!) 158/93    Pulse (!) 56   Temp 98.7 F (37.1 C)   Resp 16   Wt 68 kg   SpO2 100%   BMI 20.92 kg/m  Physical Exam Vitals and nursing note reviewed.  Constitutional:      General: He is not in acute distress.    Appearance: Normal appearance. He is well-developed. He is not ill-appearing.  HENT:     Head: Normocephalic and atraumatic.     Right Ear: External ear normal.     Left Ear: External ear normal.     Nose: Nose normal.     Mouth/Throat:     Mouth: Mucous membranes are moist.  Eyes:     General: No scleral icterus.       Right eye: No discharge.        Left eye: No discharge.  Cardiovascular:     Rate and Rhythm: Normal rate.  Pulmonary:     Effort: Pulmonary effort is normal. No respiratory distress.     Breath sounds: No stridor.  Abdominal:     General: Abdomen is flat. There is no distension.     Palpations: Abdomen is soft.     Tenderness: There is abdominal tenderness in the epigastric area. There is no guarding.  Musculoskeletal:        General: No deformity.     Cervical back: No rigidity.  Skin:    General: Skin is warm and dry.     Coloration: Skin is not cyanotic, jaundiced or pale.  Neurological:     Mental Status: He is alert and oriented to person, place, and time.     GCS: GCS eye subscore is 4. GCS verbal subscore is 5. GCS motor subscore is 6.  Psychiatric:        Speech: Speech normal.        Behavior: Behavior normal. Behavior is cooperative.     ED Results and Treatments Labs (all labs ordered are listed, but only abnormal results are displayed) Labs Reviewed  LIPASE, BLOOD - Abnormal; Notable for the following components:      Result Value   Lipase 88 (*)    All other components within normal limits  URINALYSIS, ROUTINE W REFLEX MICROSCOPIC - Abnormal; Notable for the following components:   APPearance CLOUDY (*)    All other components within normal limits  CBC WITH DIFFERENTIAL/PLATELET - Abnormal; Notable for the following components:    RBC  4.16 (*)    All other components within normal limits  COMPREHENSIVE METABOLIC PANEL WITH GFR                                                                                                                          Radiology CT ABDOMEN PELVIS W CONTRAST Result Date: 03/18/2024 CLINICAL DATA:  Epigastric pain, history of pancreatitis and pancreatic cyst EXAM: CT ABDOMEN AND PELVIS WITH CONTRAST TECHNIQUE: Multidetector CT imaging of the abdomen and pelvis was performed using the standard protocol following bolus administration of intravenous contrast. RADIATION DOSE REDUCTION: This exam was performed according to the departmental dose-optimization program which includes automated exposure control, adjustment of the mA and/or kV according to patient size and/or use of iterative reconstruction technique. CONTRAST:  OMNIPAQUE  IOHEXOL  300 MG/ML  SOLN COMPARISON:  CT abdomen pelvis 01/24/2024 FINDINGS: Lower chest: No acute abnormality. Hepatobiliary: Liver, gallbladder, biliary tree are unremarkable. Pancreas: Coarse calcifications in the pancreatic head and tail compatible with chronic pancreatitis. No pancreatic ductal dilation. The peripherally enhancing loculated fluid collection rising from the pancreatic tail and abutting the left kidney has decreased in size from 01/24/2024. Using similar measuring technique this measures 4.6 x 2.6 cm (series 8/image 62), previously 7.5 x 6.9 cm. There is a new peripherally enhancing fluid collection along the medial superior pole of the left kidney measuring 3.9 x 1.2 cm (series 2/image 20). Spleen: Normal size. The previous subcapsular fluid collection has nearly resolved. There is trace residual fluid along the superolateral spleen (series 8/image 59). Adrenals/Urinary Tract: Normal adrenal glands. No urinary calculi or hydronephrosis. Unremarkable bladder. Stomach/Bowel: Normal caliber large and small bowel. Mild wall thickening about the descending colon is  likely reactive secondary to adjacent fluid in the pericolic gutter. Stomach and appendix are within normal limits. Vascular/Lymphatic: No significant vascular findings are present. No enlarged abdominal or pelvic lymph nodes. Reproductive: Unremarkable. Other: Free fluid in the left anterior pararenal space and pericolic gutter. No free intraperitoneal air. Musculoskeletal: No acute fracture. IMPRESSION: Chronic pancreatitis. Overall decrease in size of the peripherally enhancing fluid collections in the left upper quadrant arising from the pancreatic tail and surrounding the left kidney Electronically Signed   By: Rozell Cornet M.D.   On: 03/18/2024 21:31    Pertinent labs & imaging results that were available during my care of the patient were reviewed by me and considered in my medical decision making (see MDM for details).  Medications Ordered in ED Medications  iohexol  (OMNIPAQUE ) 300 MG/ML solution 100 mL (100 mLs Intravenous Contrast Given 03/18/24 2040)  sodium chloride  0.9 % bolus 1,000 mL ( Intravenous Stopped 03/18/24 2337)  ondansetron  (ZOFRAN ) injection 4 mg (4 mg Intravenous Given 03/18/24 2234)  famotidine (PEPCID) IVPB 20 mg premix (0 mg Intravenous Stopped 03/18/24 2307)  HYDROmorphone  (DILAUDID ) injection 1 mg (1 mg Intravenous Given 03/18/24 2234)  alum & mag hydroxide-simeth (MAALOX/MYLANTA) 200-200-20 MG/5ML suspension 15 mL (15 mLs Oral Given 03/18/24 2234)  Procedures Procedures  (including critical care time)  Medical Decision Making / ED Course    Medical Decision Making:    Wilgus Deyton is a 28 y.o. male with past medical history as below, significant for prior etoh abuse now sober, chronic pancreatitis, GERD, htn who presents to the ED with complaint of epig / abd pain. The complaint involves an extensive differential diagnosis and  also carries with it a high risk of complications and morbidity.  Serious etiology was considered. Ddx includes but is not limited to: Differential diagnosis includes but is not exclusive to acute cholecystitis, intrathoracic causes for epigastric abdominal pain, gastritis, duodenitis, pancreatitis, small bowel or large bowel obstruction, abdominal aortic aneurysm, hernia, gastritis, etc.   Complete initial physical exam performed, notably the patient was in no distress, hds, not peritoneal.    Reviewed and confirmed nursing documentation for past medical history, family history, social history.  Vital signs reviewed.     Brief summary:  28 yo/m w/ hx pancreatitis here with epig pain similar to prior pancreatitis Hds, not peritoneal abd He has epig mild ttp   Clinical Course as of 03/18/24 2347  Thu Mar 18, 2024  2207 CT w/ evidence of chronic pancreatitis  [SG]    Clinical Course User Index [SG] Teddi Favors, DO     Labs stable Lipase mild elev, chronic pancreatitis on CT. Feeling much better Tolerating po Ambulatory Pain likely 2/2 chronic pancreatitis Will give short course of analgesics for home, ppi and carafate. Bland diet. Advised f/u with pcp  Patient in no distress and overall condition improved here in the ED. Detailed discussions were had with the patient/guardian regarding current findings, and need for close f/u with PCP or on call doctor. The patient/guardian has been instructed to return immediately if the symptoms worsen in any way for re-evaluation. Patient/guardian verbalized understanding and is in agreement with current care plan. All questions answered prior to discharge.           Additional history obtained: -Additional history obtained from na -External records from outside source obtained and reviewed including: Chart review including previous notes, labs, imaging, consultation notes including  Pdmp    Lab Tests: -I ordered, reviewed, and  interpreted labs.   The pertinent results include:   Labs Reviewed  LIPASE, BLOOD - Abnormal; Notable for the following components:      Result Value   Lipase 88 (*)    All other components within normal limits  URINALYSIS, ROUTINE W REFLEX MICROSCOPIC - Abnormal; Notable for the following components:   APPearance CLOUDY (*)    All other components within normal limits  CBC WITH DIFFERENTIAL/PLATELET - Abnormal; Notable for the following components:   RBC 4.16 (*)    All other components within normal limits  COMPREHENSIVE METABOLIC PANEL WITH GFR    Notable for lipase elev  EKG   EKG Interpretation Date/Time:  Thursday Mar 18 2024 18:49:40 EDT Ventricular Rate:  80 PR Interval:  147 QRS Duration:  82 QT Interval:  349 QTC Calculation: 403 R Axis:   61  Text Interpretation: Sinus rhythm similar to prior no stemi twi in lead 3 noted in prior similar 9/22 Confirmed by Russella Courts ([id number]) on 03/18/2024 11:45:13 PM         Imaging Studies ordered: I ordered imaging studies including CTAP I independently visualized the following imaging with scope of interpretation limited to determining acute life threatening conditions related to emergency care; findings noted above I agree with  the radiologist interpretation If any imaging was obtained with contrast I closely monitored patient for any possible adverse reaction a/w contrast administration in the emergency department   Medicines ordered and prescription drug management: Meds ordered this encounter  Medications   iohexol  (OMNIPAQUE ) 300 MG/ML solution 100 mL   sodium chloride  0.9 % bolus 1,000 mL   ondansetron  (ZOFRAN ) injection 4 mg   famotidine (PEPCID) IVPB 20 mg premix   DISCONTD: morphine  (PF) 4 MG/ML injection 4 mg   HYDROmorphone  (DILAUDID ) injection 1 mg   alum & mag hydroxide-simeth (MAALOX/MYLANTA) 200-200-20 MG/5ML suspension 15 mL   pantoprazole  (PROTONIX ) 40 MG tablet    Sig: Take 1 tablet (40 mg total) by  mouth daily for 14 days.    Dispense:  14 tablet    Refill:  0   sucralfate (CARAFATE) 1 g tablet    Sig: Take 1 tablet (1 g total) by mouth with breakfast, with lunch, and with evening meal for 7 days.    Dispense:  21 tablet    Refill:  0   oxyCODONE  (ROXICODONE ) 5 MG immediate release tablet    Sig: Take 1 tablet (5 mg total) by mouth every 4 (four) hours as needed for severe pain (pain score 7-10).    Dispense:  10 tablet    Refill:  0    -I have reviewed the patients home medicines and have made adjustments as needed   Consultations Obtained: na   Cardiac Monitoring: Continuous pulse oximetry interpreted by myself, 100% on RA.    Social Determinants of Health:  Diagnosis or treatment significantly limited by social determinants of health: former smoker   Reevaluation: After the interventions noted above, I reevaluated the patient and found that they have improved  Co morbidities that complicate the patient evaluation  Past Medical History:  Diagnosis Date   Acute pancreatitis 09/04/2019   Acute respiratory failure with hypoxia (HCC)    Attempted suicide (HCC)    GERD (gastroesophageal reflux disease) 09/04/2019   Pancreatitis, alcoholic, acute 09/04/2019      Dispostion: Disposition decision including need for hospitalization was considered, and patient discharged from emergency department.    Final Clinical Impression(s) / ED Diagnoses Final diagnoses:  Chronic pancreatitis, unspecified pancreatitis type (HCC)        Teddi Favors, DO 03/18/24 2347

## 2024-03-18 NOTE — Discharge Instructions (Signed)
 It was a pleasure caring for you today in the emergency department.  Be sure to eat a bland diet, avoid alcohol and tobacco, avoid spicy/fried foods  Please return to the emergency department for any worsening or worrisome symptoms.

## 2024-03-19 ENCOUNTER — Telehealth (HOSPITAL_BASED_OUTPATIENT_CLINIC_OR_DEPARTMENT_OTHER): Payer: Self-pay

## 2024-03-19 MED ORDER — OXYCODONE-ACETAMINOPHEN 5-325 MG PO TABS: 1.0000 | ORAL_TABLET | Freq: Four times a day (QID) | ORAL | 0 refills | Status: AC | PRN

## 2024-03-19 MED ORDER — SUCRALFATE 1 G PO TABS
1.0000 g | ORAL_TABLET | Freq: Three times a day (TID) | ORAL | 0 refills | Status: DC
Start: 2024-03-19 — End: 2024-03-19

## 2024-03-19 MED ORDER — SUCRALFATE 1 G PO TABS
1.0000 g | ORAL_TABLET | Freq: Three times a day (TID) | ORAL | 0 refills | Status: AC
Start: 2024-03-19 — End: ?

## 2024-03-19 MED ORDER — PANTOPRAZOLE SODIUM 40 MG PO TBEC: 40.0000 mg | DELAYED_RELEASE_TABLET | Freq: Every day | ORAL | 0 refills | Status: DC

## 2024-03-19 MED ORDER — PANTOPRAZOLE SODIUM 20 MG PO TBEC: 20.0000 mg | DELAYED_RELEASE_TABLET | Freq: Every day | ORAL | 0 refills | Status: AC

## 2024-03-19 MED ORDER — OXYCODONE-ACETAMINOPHEN 5-325 MG PO TABS: 1.0000 | ORAL_TABLET | Freq: Four times a day (QID) | ORAL | 0 refills | Status: DC | PRN

## 2024-03-19 NOTE — Telephone Encounter (Cosign Needed)
 Patient's pharmacy changed per patient request due to insurance coverage. Pantoprazole , sucralfate and oxycodone  called to new pharmacy.

## 2024-03-19 NOTE — Telephone Encounter (Signed)
 Pt states that he was seen here at the ED last night and had prescriptions sent to [facility] and they don't take his insurance. Is requesting for meds to go to [facility] on [address].

## 2024-05-11 ENCOUNTER — Other Ambulatory Visit: Payer: Self-pay

## 2024-05-11 ENCOUNTER — Encounter (HOSPITAL_BASED_OUTPATIENT_CLINIC_OR_DEPARTMENT_OTHER): Payer: Self-pay | Admitting: Radiology

## 2024-05-11 ENCOUNTER — Emergency Department (HOSPITAL_BASED_OUTPATIENT_CLINIC_OR_DEPARTMENT_OTHER)
Admission: EM | Admit: 2024-05-11 | Discharge: 2024-05-12 | Disposition: A | Discharge status: Home / Self Care | Attending: Emergency Medicine | Admitting: Emergency Medicine

## 2024-05-11 DIAGNOSIS — K852 Alcohol induced acute pancreatitis without necrosis or infection: Secondary | ICD-10-CM | POA: Insufficient documentation

## 2024-05-11 DIAGNOSIS — R1010 Upper abdominal pain, unspecified: Secondary | ICD-10-CM | POA: Diagnosis present

## 2024-05-11 LAB — COMPREHENSIVE METABOLIC PANEL WITH GFR
ALT: 10 U/L (ref 0–44)
AST: 17 U/L (ref 15–41)
Albumin: 4.4 g/dL (ref 3.5–5.0)
Alkaline Phosphatase: 66 U/L (ref 38–126)
Anion gap: 11 (ref 5–15)
BUN: 16 mg/dL (ref 6–20)
CO2: 28 mmol/L (ref 22–32)
Calcium: 9.7 mg/dL (ref 8.9–10.3)
Chloride: 104 mmol/L (ref 98–111)
Creatinine, Ser: 1 mg/dL (ref 0.61–1.24)
GFR, Estimated: >60 mL/min (ref >60)
Glucose, Bld: 94 mg/dL (ref 70–99)
Potassium: 4.2 mmol/L (ref 3.5–5.1)
Sodium: 143 mmol/L (ref 135–145)
Total Bilirubin: 0.3 mg/dL (ref 0.0–1.2)
Total Protein: 7.3 g/dL (ref 6.5–8.1)

## 2024-05-11 LAB — CBC
HCT: 38.6 % — ABNORMAL LOW (ref 39.0–52.0)
Hemoglobin: 12.8 g/dL — ABNORMAL LOW (ref 13.0–17.0)
MCH: 30.8 pg (ref 26.0–34.0)
MCHC: 33.2 g/dL (ref 30.0–36.0)
MCV: 92.8 fL (ref 80.0–100.0)
Platelets: 343 K/uL (ref 150–400)
RBC: 4.16 MIL/uL — ABNORMAL LOW (ref 4.22–5.81)
RDW: 13.2 % (ref 11.5–15.5)
WBC: 8 K/uL (ref 4.0–10.5)
nRBC: 0 % (ref 0.0–0.2)

## 2024-05-11 LAB — URINALYSIS, ROUTINE W REFLEX MICROSCOPIC
Glucose, UA: NEGATIVE mg/dL
Hgb urine dipstick: NEGATIVE
Ketones, ur: NEGATIVE mg/dL
Leukocytes,Ua: NEGATIVE
Nitrite: NEGATIVE
Protein, ur: NEGATIVE mg/dL
Specific Gravity, Urine: >=1.03 (ref 1.005–1.030)
pH: 5.5 (ref 5.0–8.0)

## 2024-05-11 LAB — LIPASE, BLOOD: Lipase: 106 U/L — ABNORMAL HIGH (ref 11–51)

## 2024-05-11 MED ORDER — MORPHINE SULFATE (PF) 4 MG/ML IV SOLN
4.0000 mg | Freq: Once | INTRAVENOUS | Status: AC
  Administered 2024-05-11: 4 mg via INTRAVENOUS
  Filled 2024-05-11: qty 1

## 2024-05-11 MED ORDER — PANTOPRAZOLE SODIUM 40 MG IV SOLR
40.0000 mg | Freq: Once | INTRAVENOUS | Status: AC
  Administered 2024-05-11: 40 mg via INTRAVENOUS
  Filled 2024-05-11: qty 10

## 2024-05-11 MED ORDER — SODIUM CHLORIDE 0.9 % IV BOLUS
1000.0000 mL | Freq: Once | INTRAVENOUS | Status: AC
  Administered 2024-05-11: 1000 mL via INTRAVENOUS

## 2024-05-11 MED ORDER — HYDROMORPHONE HCL 1 MG/ML IJ SOLN
1.0000 mg | Freq: Once | INTRAMUSCULAR | Status: AC
  Administered 2024-05-11: 1 mg via INTRAVENOUS
  Filled 2024-05-11: qty 1

## 2024-05-11 MED ORDER — KETOROLAC TROMETHAMINE 30 MG/ML IJ SOLN
15.0000 mg | Freq: Once | INTRAMUSCULAR | Status: DC
  Filled 2024-05-11: qty 1

## 2024-05-11 MED ORDER — ONDANSETRON HCL 4 MG/2ML IJ SOLN
4.0000 mg | Freq: Once | INTRAMUSCULAR | Status: AC
  Administered 2024-05-12: 4 mg via INTRAVENOUS
  Filled 2024-05-11: qty 2

## 2024-05-11 MED ORDER — ONDANSETRON HCL 4 MG/2ML IJ SOLN
4.0000 mg | Freq: Once | INTRAMUSCULAR | Status: AC
  Administered 2024-05-11: 4 mg via INTRAVENOUS
  Filled 2024-05-11: qty 2

## 2024-05-11 MED ORDER — HALOPERIDOL LACTATE 5 MG/ML IJ SOLN
2.0000 mg | Freq: Once | INTRAMUSCULAR | Status: DC
  Filled 2024-05-11: qty 1

## 2024-05-11 NOTE — ED Triage Notes (Signed)
 Pt states left upper abdominal pain that started a couple days ago but has gotten worse.The pain is now going up into his chest. States that he has chronic pancreatitis and thinks that this may be a flare.

## 2024-05-11 NOTE — ED Provider Notes (Signed)
 [facility] Provider Note   CSN: [id number] Arrival date & time: 05/11/24  1733     Patient presents with: Abdominal Pain   Random Dobrowski is a 28 y.o. male.   Patient is a 28 year old male who presents with upper abdominal pain.  He said it started about 2 days ago but was intermittent and today has been a little more persistent.  He has had a prior history of pancreatitis and says this is similar.  He denies any change in his stools.  No urinary symptoms.  He had a prior history of alcohol abuse but now very rarely drinks alcohol.  He said he had 1 mixed drink about a week ago.  He says he is only had about 3 or 4 drinks in the last several years.  He has had several flareups of pancreatitis per his report.  On chart review, he was admitted in April for pancreatitis with a pseudocyst that did not require intervention.  He denies any fevers or chills.  No shortness of breath or chest pain.       Prior to Admission medications   Medication Sig Start Date End Date Taking? Authorizing Provider  acetaminophen  (TYLENOL ) 500 MG tablet Take 2 tablets (1,000 mg total) by mouth 3 (three) times daily. 01/28/24   Celestina Czar, MD  cyclobenzaprine  (FLEXERIL ) 5 MG tablet Take 1-2 tablets (5-10 mg total) by mouth 3 (three) times daily as needed for muscle spasms. 01/21/24   Brimage, Vondra, DO  metroNIDAZOLE  (FLAGYL ) 500 MG tablet Take 1 tablet (500 mg total) by mouth every 12 (twelve) hours. 01/28/24   Celestina Czar, MD  ondansetron  (ZOFRAN ) 4 MG tablet Take 1 tablet (4 mg total) by mouth as needed for nausea or vomiting. 01/28/24   Celestina Czar, MD  oxyCODONE  (ROXICODONE ) 5 MG immediate release tablet Take 1 tablet (5 mg total) by mouth every 4 (four) hours as needed for severe pain (pain score 7-10). 03/18/24   Elnor Jayson LABOR, DO  oxyCODONE -acetaminophen  (PERCOCET/ROXICET) 5-325 MG tablet Take 1 tablet by mouth every 6 (six) hours as needed for  severe pain (pain score 7-10). 03/19/24   Odell Balls, PA-C  pantoprazole  (PROTONIX ) 20 MG tablet Take 1 tablet (20 mg total) by mouth daily. 03/19/24   Odell Balls, PA-C  sucralfate  (CARAFATE ) 1 g tablet Take 1 tablet (1 g total) by mouth 4 (four) times daily -  with meals and at bedtime. 03/19/24   Odell Balls, PA-C    Allergies: Patient has no known allergies.    Review of Systems  Constitutional:  Negative for chills, diaphoresis, fatigue and fever.  HENT:  Negative for congestion, rhinorrhea and sneezing.   Eyes: Negative.   Respiratory:  Negative for cough, chest tightness and shortness of breath.   Cardiovascular:  Negative for chest pain and leg swelling.  Gastrointestinal:  Positive for abdominal pain and nausea. Negative for blood in stool, diarrhea and vomiting.  Genitourinary:  Negative for difficulty urinating, flank pain, frequency and hematuria.  Musculoskeletal:  Negative for arthralgias and back pain.  Skin:  Negative for rash.  Neurological:  Negative for dizziness, speech difficulty, weakness, numbness and headaches.    Updated Vital Signs BP (!) 132/93   Pulse 80   Temp 98.1 F (36.7 C) (Oral)   Resp 18   Ht 5' 11 (1.803 m)   Wt 68 kg   SpO2 100%   BMI 20.92 kg/m   Physical Exam Constitutional:  Appearance: He is well-developed.  HENT:     Head: Normocephalic and atraumatic.  Eyes:     Pupils: Pupils are equal, round, and reactive to light.  Cardiovascular:     Rate and Rhythm: Normal rate and regular rhythm.     Heart sounds: Normal heart sounds.  Pulmonary:     Effort: Pulmonary effort is normal. No respiratory distress.     Breath sounds: Normal breath sounds. No wheezing or rales.  Chest:     Chest wall: No tenderness.  Abdominal:     General: Bowel sounds are normal.     Palpations: Abdomen is soft.     Tenderness: There is abdominal tenderness in the epigastric area. There is no guarding or rebound.  Musculoskeletal:         General: Normal range of motion.     Cervical back: Normal range of motion and neck supple.  Lymphadenopathy:     Cervical: No cervical adenopathy.  Skin:    General: Skin is warm and dry.     Findings: No rash.  Neurological:     Mental Status: He is alert and oriented to person, place, and time.     (all labs ordered are listed, but only abnormal results are displayed) Labs Reviewed  LIPASE, BLOOD - Abnormal; Notable for the following components:      Result Value   Lipase 106 (*)    All other components within normal limits  CBC - Abnormal; Notable for the following components:   RBC 4.16 (*)    Hemoglobin 12.8 (*)    HCT 38.6 (*)    All other components within normal limits  URINALYSIS, ROUTINE W REFLEX MICROSCOPIC - Abnormal; Notable for the following components:   Bilirubin Urine SMALL (*)    All other components within normal limits  COMPREHENSIVE METABOLIC PANEL WITH GFR    EKG: EKG Interpretation Date/Time:  Tuesday May 11 2024 17:47:19 EDT Ventricular Rate:  89 PR Interval:  131 QRS Duration:  92 QT Interval:  347 QTC Calculation: 423 R Axis:   77  Text Interpretation: Sinus rhythm Right atrial enlargement ST elev, probable normal early repol pattern since last tracing no significant change Confirmed by Lenor Hollering [phone]) on 05/11/2024 10:05:06 PM  Radiology: No results found.   Procedures   Medications Ordered in the ED  HYDROmorphone  (DILAUDID ) injection 1 mg (has no administration in time range)  ondansetron  (ZOFRAN ) injection 4 mg (4 mg Intravenous Given 05/11/24 2126)  morphine  (PF) 4 MG/ML injection 4 mg (4 mg Intravenous Given 05/11/24 2126)  sodium chloride  0.9 % bolus 1,000 mL (0 mLs Intravenous Stopped 05/11/24 2256)  HYDROmorphone  (DILAUDID ) injection 1 mg (1 mg Intravenous Given 05/11/24 2229)                                    Medical Decision Making Amount and/or Complexity of Data Reviewed Labs: ordered.  Risk Prescription drug  management.   This patient presents to the ED for concern of upper abdominal pain, this involves an extensive number of treatment options, and is a complaint that carries with it a high risk of complications and morbidity.  I considered the following differential and admission for this acute, potentially life threatening condition.  The differential diagnosis includes pancreatitis, gastritis, gastroenteritis, ACS  MDM:    Patient is a 28 year old who presents with upper abdominal pain.  This pain is similar to his  prior episodes of pancreatitis.  His lipase is mildly elevated which is consistent with a flareup of his pancreatitis.  Other labs are nonconcerning.  LFTs are normal.  Given that his symptoms are similar to prior episodes, I do not feel that repeat CT imaging is needed.  He had a CT scan 2 months ago that was nonconcerning.  He was given pain medication here in the ED.  He has had some improvement in symptoms.  He still having pain and we will give him an additional dose of pain medication.  Care turned over to Dr. Nettie pending reassessment following repeat medication.  (Labs, imaging, consults)  Labs: I Ordered, and personally interpreted labs.  The pertinent results include: Elevated lipase, normal LFTs  Imaging Studies ordered:   Additional history obtained from chart.  External records from outside source obtained and reviewed including prior notes  Cardiac Monitoring: The patient was maintained on a cardiac monitor.  If on the cardiac monitor, I personally viewed and interpreted the cardiac monitored which showed an underlying rhythm of: Sinus rhythm  Reevaluation: After the interventions noted above, I reevaluated the patient and found that they have :improved  Social Determinants of Health:  no PCP  Disposition:  pending  Co morbidities that complicate the patient evaluation  Past Medical History:  Diagnosis Date   Acute pancreatitis 09/04/2019   Acute  respiratory failure with hypoxia (HCC)    Attempted suicide (HCC)    GERD (gastroesophageal reflux disease) 09/04/2019   Pancreatitis, alcoholic, acute 09/04/2019     Medicines Meds ordered this encounter  Medications   ondansetron  (ZOFRAN ) injection 4 mg   morphine  (PF) 4 MG/ML injection 4 mg   sodium chloride  0.9 % bolus 1,000 mL   HYDROmorphone  (DILAUDID ) injection 1 mg   HYDROmorphone  (DILAUDID ) injection 1 mg    I have reviewed the patients home medicines and have made adjustments as needed  Problem List / ED Course: Problem List Items Addressed This Visit       Digestive   Alcoholic pancreatitis - Primary   Relevant Medications   HYDROmorphone  (DILAUDID ) injection 1 mg             Final diagnoses:  Alcohol-induced acute pancreatitis, unspecified complication status    ED Discharge Orders     None          Lenor Hollering, MD 05/11/24 2329

## 2024-09-05 ENCOUNTER — Encounter (HOSPITAL_COMMUNITY): Payer: Self-pay | Admitting: Emergency Medicine

## 2024-09-05 ENCOUNTER — Other Ambulatory Visit: Payer: Self-pay

## 2024-09-05 ENCOUNTER — Emergency Department (HOSPITAL_COMMUNITY)

## 2024-09-05 ENCOUNTER — Emergency Department (HOSPITAL_COMMUNITY)
Admission: EM | Admit: 2024-09-05 | Discharge: 2024-09-06 | Attending: Emergency Medicine | Admitting: Emergency Medicine

## 2024-09-05 DIAGNOSIS — Z5329 Procedure and treatment not carried out because of patient's decision for other reasons: Secondary | ICD-10-CM | POA: Insufficient documentation

## 2024-09-05 DIAGNOSIS — R1013 Epigastric pain: Secondary | ICD-10-CM | POA: Diagnosis not present

## 2024-09-05 DIAGNOSIS — K859 Acute pancreatitis without necrosis or infection, unspecified: Secondary | ICD-10-CM | POA: Diagnosis present

## 2024-09-05 LAB — CBC WITH DIFFERENTIAL/PLATELET
Abs Immature Granulocytes: 0.03 K/uL (ref 0.00–0.07)
Basophils Absolute: 0 K/uL (ref 0.0–0.1)
Basophils Relative: 0 %
Eosinophils Absolute: 0.3 K/uL (ref 0.0–0.5)
Eosinophils Relative: 3 %
HCT: 45.3 % (ref 39.0–52.0)
Hemoglobin: 14.8 g/dL (ref 13.0–17.0)
Immature Granulocytes: 0 %
Lymphocytes Relative: 20 %
Lymphs Abs: 2 K/uL (ref 0.7–4.0)
MCH: 30.3 pg (ref 26.0–34.0)
MCHC: 32.7 g/dL (ref 30.0–36.0)
MCV: 92.6 fL (ref 80.0–100.0)
Monocytes Absolute: 0.6 K/uL (ref 0.1–1.0)
Monocytes Relative: 7 %
Neutro Abs: 6.8 K/uL (ref 1.7–7.7)
Neutrophils Relative %: 70 %
Platelets: 314 K/uL (ref 150–400)
RBC: 4.89 MIL/uL (ref 4.22–5.81)
RDW: 13.8 % (ref 11.5–15.5)
WBC: 9.7 K/uL (ref 4.0–10.5)
nRBC: 0 % (ref 0.0–0.2)

## 2024-09-05 LAB — COMPREHENSIVE METABOLIC PANEL WITH GFR
ALT: 12 U/L (ref 0–44)
AST: 18 U/L (ref 15–41)
Albumin: 3.8 g/dL (ref 3.5–5.0)
Alkaline Phosphatase: 61 U/L (ref 38–126)
Anion gap: 9 (ref 5–15)
BUN: 20 mg/dL (ref 6–20)
CO2: 26 mmol/L (ref 22–32)
Calcium: 8.8 mg/dL — ABNORMAL LOW (ref 8.9–10.3)
Chloride: 101 mmol/L (ref 98–111)
Creatinine, Ser: 1.08 mg/dL (ref 0.61–1.24)
GFR, Estimated: >60 mL/min (ref >60)
Glucose, Bld: 155 mg/dL — ABNORMAL HIGH (ref 70–99)
Potassium: 3.6 mmol/L (ref 3.5–5.1)
Sodium: 136 mmol/L (ref 135–145)
Total Bilirubin: 0.9 mg/dL (ref 0.0–1.2)
Total Protein: 7 g/dL (ref 6.5–8.1)

## 2024-09-05 LAB — URINALYSIS, ROUTINE W REFLEX MICROSCOPIC
Bilirubin Urine: NEGATIVE
Glucose, UA: NEGATIVE mg/dL
Hgb urine dipstick: NEGATIVE
Ketones, ur: NEGATIVE mg/dL
Leukocytes,Ua: NEGATIVE
Nitrite: NEGATIVE
Protein, ur: NEGATIVE mg/dL
Specific Gravity, Urine: 1.035 — ABNORMAL HIGH (ref 1.005–1.030)
pH: 5 (ref 5.0–8.0)

## 2024-09-05 LAB — TROPONIN I (HIGH SENSITIVITY): Troponin I (High Sensitivity): 2 ng/L (ref <18)

## 2024-09-05 LAB — ETHANOL: Alcohol, Ethyl (B): <15 mg/dL (ref <15)

## 2024-09-05 LAB — LIPASE, BLOOD: Lipase: 120 U/L — ABNORMAL HIGH (ref 11–51)

## 2024-09-05 NOTE — ED Triage Notes (Signed)
 Pt reports he thinks he is having a pancreatitis flare up. Hx of same. C/o L stabbing abd pain x 2 days w/ n/v/d.

## 2024-09-05 NOTE — ED Triage Notes (Signed)
 Patient here from home believes he is having a flare up of his pancreatitis. C/o upper L abdominal pain x2 day w/ n/v/d.

## 2024-09-05 NOTE — ED Provider Triage Note (Signed)
 Emergency Medicine Provider Triage Evaluation Note  Joshua Morton , a 28 y.o. male  was evaluated in triage.  Pt complains of epigastric pain that is burning in nature.  Has been going on for 2 days with nausea and vomiting.  Diarrhea without constipation.  Not black or bloody.  No hematemesis or coffee-ground emesis.  Denies any fever.  Reports this feels like his pancreatitis.  He is not having any shortness of breath or fevers.  Reports that he has significantly lowered his drinking and only drinks every once in a while.  Last drink was a week ago.  Review of Systems  Positive:  Negative:   Physical Exam  BP 108/84   Pulse 99   Temp 98.5 F (36.9 C)   Resp 17   SpO2 98%  Gen:   Awake, no distress   Resp:  Normal effort  MSK:   Moves extremities without difficulty  Other:  Abdominal tender to palpation mainly more towards the epigastric region.  Soft.  Medical Decision Making  Medically screening exam initiated at 8:27 PM.  Appropriate orders placed.  Joshua Morton was informed that the remainder of the evaluation will be completed by another provider, this initial triage assessment does not replace that evaluation, and the importance of remaining in the ED until their evaluation is complete.  Labs and imaging ordered   Bernis Ernst, DEVONNA 09/05/24 2028

## 2024-09-06 NOTE — ED Notes (Signed)
 Pt called for vital reassessment, no response. x3
